# Patient Record
Sex: Female | Born: 2000 | Hispanic: Yes | Marital: Single | Smoking: Never smoker
Health system: Southern US, Community
[De-identification: ages and names within clinical notes are randomized; demographics above are authoritative.]

## PROBLEM LIST (undated history)

## (undated) DIAGNOSIS — F319 Bipolar disorder, unspecified: Secondary | ICD-10-CM

## (undated) DIAGNOSIS — N83209 Unspecified ovarian cyst, unspecified side: Secondary | ICD-10-CM

## (undated) DIAGNOSIS — J45909 Unspecified asthma, uncomplicated: Secondary | ICD-10-CM

---

## 2020-04-11 ENCOUNTER — Emergency Department (HOSPITAL_BASED_OUTPATIENT_CLINIC_OR_DEPARTMENT_OTHER): Payer: 59

## 2020-04-11 ENCOUNTER — Emergency Department (HOSPITAL_BASED_OUTPATIENT_CLINIC_OR_DEPARTMENT_OTHER)
Admission: EM | Admit: 2020-04-11 | Discharge: 2020-04-11 | Disposition: A | Discharge status: Home / Self Care | Payer: 59 | Attending: Emergency Medicine | Admitting: Emergency Medicine

## 2020-04-11 ENCOUNTER — Other Ambulatory Visit: Payer: Self-pay

## 2020-04-11 ENCOUNTER — Encounter (HOSPITAL_BASED_OUTPATIENT_CLINIC_OR_DEPARTMENT_OTHER): Payer: Self-pay | Admitting: *Deleted

## 2020-04-11 DIAGNOSIS — O26891 Other specified pregnancy related conditions, first trimester: Secondary | ICD-10-CM

## 2020-04-11 DIAGNOSIS — Z3A01 Less than 8 weeks gestation of pregnancy: Secondary | ICD-10-CM | POA: Insufficient documentation

## 2020-04-11 DIAGNOSIS — R102 Pelvic and perineal pain: Secondary | ICD-10-CM | POA: Insufficient documentation

## 2020-04-11 DIAGNOSIS — O219 Vomiting of pregnancy, unspecified: Secondary | ICD-10-CM | POA: Diagnosis not present

## 2020-04-11 DIAGNOSIS — R1084 Generalized abdominal pain: Secondary | ICD-10-CM | POA: Diagnosis not present

## 2020-04-11 DIAGNOSIS — O99891 Other specified diseases and conditions complicating pregnancy: Secondary | ICD-10-CM | POA: Insufficient documentation

## 2020-04-11 HISTORY — DX: Unspecified ovarian cyst, unspecified side: N83.209

## 2020-04-11 LAB — CBC WITH DIFFERENTIAL/PLATELET
Abs Immature Granulocytes: 0.05 10*3/uL (ref 0.00–0.07)
Basophils Absolute: 0 10*3/uL (ref 0.0–0.1)
Basophils Relative: 0 %
Eosinophils Absolute: 0.1 10*3/uL (ref 0.0–0.5)
Eosinophils Relative: 1 %
HCT: 37.3 % (ref 36.0–46.0)
Hemoglobin: 12.9 g/dL (ref 12.0–15.0)
Immature Granulocytes: 0 %
Lymphocytes Relative: 19 %
Lymphs Abs: 2.2 10*3/uL (ref 0.7–4.0)
MCH: 29.2 pg (ref 26.0–34.0)
MCHC: 34.6 g/dL (ref 30.0–36.0)
MCV: 84.4 fL (ref 80.0–100.0)
Monocytes Absolute: 0.9 10*3/uL (ref 0.1–1.0)
Monocytes Relative: 8 %
Neutro Abs: 8.4 10*3/uL — ABNORMAL HIGH (ref 1.7–7.7)
Neutrophils Relative %: 72 %
Platelets: 350 10*3/uL (ref 150–400)
RBC: 4.42 MIL/uL (ref 3.87–5.11)
RDW: 13 % (ref 11.5–15.5)
WBC: 11.6 10*3/uL — ABNORMAL HIGH (ref 4.0–10.5)
nRBC: 0 % (ref 0.0–0.2)

## 2020-04-11 LAB — COMPREHENSIVE METABOLIC PANEL
ALT: 13 U/L (ref 0–44)
AST: 20 U/L (ref 15–41)
Albumin: 4.6 g/dL (ref 3.5–5.0)
Alkaline Phosphatase: 55 U/L (ref 38–126)
Anion gap: 12 (ref 5–15)
BUN: 10 mg/dL (ref 6–20)
CO2: 18 mmol/L — ABNORMAL LOW (ref 22–32)
Calcium: 9.2 mg/dL (ref 8.9–10.3)
Chloride: 106 mmol/L (ref 98–111)
Creatinine, Ser: 0.56 mg/dL (ref 0.44–1.00)
GFR calc Af Amer: >60 mL/min (ref >60)
GFR calc non Af Amer: >60 mL/min (ref >60)
Glucose, Bld: 84 mg/dL (ref 70–99)
Potassium: 3.6 mmol/L (ref 3.5–5.1)
Sodium: 136 mmol/L (ref 135–145)
Total Bilirubin: 0.9 mg/dL (ref 0.3–1.2)
Total Protein: 7.5 g/dL (ref 6.5–8.1)

## 2020-04-11 LAB — URINALYSIS, ROUTINE W REFLEX MICROSCOPIC
Glucose, UA: NEGATIVE mg/dL
Hgb urine dipstick: NEGATIVE
Ketones, ur: >80 mg/dL — AB
Leukocytes,Ua: NEGATIVE
Nitrite: NEGATIVE
Protein, ur: NEGATIVE mg/dL
Specific Gravity, Urine: >1.03 — ABNORMAL HIGH (ref 1.005–1.030)
pH: 6 (ref 5.0–8.0)

## 2020-04-11 LAB — HCG, QUANTITATIVE, PREGNANCY: hCG, Beta Chain, Quant, S: 1788 m[IU]/mL — ABNORMAL HIGH (ref <5)

## 2020-04-11 LAB — HCG, SERUM, QUALITATIVE: Preg, Serum: POSITIVE — AB

## 2020-04-11 LAB — WET PREP, GENITAL
Clue Cells Wet Prep HPF POC: NONE SEEN
Sperm: NONE SEEN
Trich, Wet Prep: NONE SEEN
Yeast Wet Prep HPF POC: NONE SEEN

## 2020-04-11 LAB — PREGNANCY, URINE: Preg Test, Ur: POSITIVE — AB

## 2020-04-11 MED ORDER — ONDANSETRON HCL 4 MG/2ML IJ SOLN
4.0000 mg | Freq: Once | INTRAMUSCULAR | Status: AC
  Administered 2020-04-11: 4 mg via INTRAVENOUS
  Filled 2020-04-11: qty 2

## 2020-04-11 MED ORDER — FENTANYL CITRATE (PF) 100 MCG/2ML IJ SOLN
50.0000 ug | Freq: Once | INTRAMUSCULAR | Status: AC
  Administered 2020-04-11: 50 ug via INTRAVENOUS
  Filled 2020-04-11: qty 2

## 2020-04-11 MED ORDER — MORPHINE SULFATE (PF) 4 MG/ML IV SOLN
4.0000 mg | Freq: Once | INTRAVENOUS | Status: AC
  Administered 2020-04-11: 4 mg via INTRAVENOUS
  Filled 2020-04-11: qty 1

## 2020-04-11 MED ORDER — SODIUM CHLORIDE 0.9 % IV BOLUS
1000.0000 mL | Freq: Once | INTRAVENOUS | Status: AC
  Administered 2020-04-11: 1000 mL via INTRAVENOUS

## 2020-04-11 NOTE — ED Provider Notes (Signed)
[facility] EMERGENCY DEPARTMENT Provider Note   CSN: [id number] Arrival date & time: 04/11/20  1404     History Chief Complaint  Patient presents with  . Abdominal Pain    2wks preg    Martha Velasquez is a 19 y.o. female who presents for evaluation of acute onset abdominal pain that started about 20 minutes prior to ED arrival.  Patient reports that she was recently told that she was pregnant.  She went to her doctor's office and told that she was 2 to [redacted] weeks pregnant.  Her LMP was 03/09/20.  She reports she has had some nausea/vomiting since then as well as some occasional abdominal cramping.  She otherwise reports she has been in her normal state of health.  About 20 minutes prior to arrival, she started getting a sharp stabbing pain in her left lower quadrant.  She does have a history of ovarian cyst.  She states that she felt a gush but has not had any vaginal bleeding.  She has not had any fever, chest pain, dysuria, hematuria.  She was recently diagnosed with a UTI and states she is on antibiotics.  The history is provided by the patient.       Past Medical History:  Diagnosis Date  . Ovarian cyst     There are no problems to display for this patient.   History reviewed. No pertinent surgical history.   OB History   No obstetric history on file.     No family history on file.  Social History   Tobacco Use  . Smoking status: Never Smoker  . Smokeless tobacco: Never Used  Substance Use Topics  . Alcohol use: Not Currently  . Drug use: Not Currently    Home Medications Prior to Admission medications   Not on File    Allergies    Aspirin and Penicillins  Review of Systems   Review of Systems  Constitutional: Negative for fever.  Respiratory: Negative for cough and shortness of breath.   Cardiovascular: Negative for chest pain.  Gastrointestinal: Positive for abdominal pain. Negative for nausea and vomiting.  Genitourinary: Negative  for dysuria and hematuria.  Neurological: Negative for headaches.  All other systems reviewed and are negative.   Physical Exam Updated Vital Signs BP 128/88 (BP Location: Right Arm)   Pulse 68   Temp 98.9 F (37.2 C) (Oral)   Resp 16   Ht 5\' 2"  (1.575 m)   Wt 66.2 kg   LMP 03/10/2020   SpO2 99%   BMI 26.70 kg/m   Physical Exam Vitals and nursing note reviewed. Exam conducted with a chaperone present.  Constitutional:      Appearance: Normal appearance. She is well-developed.     Comments: Tearful  HENT:     Head: Normocephalic and atraumatic.  Eyes:     General: Lids are normal.     Conjunctiva/sclera: Conjunctivae normal.     Pupils: Pupils are equal, round, and reactive to light.  Cardiovascular:     Rate and Rhythm: Normal rate and regular rhythm.     Pulses: Normal pulses.     Heart sounds: Normal heart sounds. No murmur. No friction rub. No gallop.   Pulmonary:     Effort: Pulmonary effort is normal.     Breath sounds: Normal breath sounds.     Comments: Lungs clear to auscultation bilaterally.  Symmetric chest rise.  No wheezing, rales, rhonchi. Abdominal:     Palpations: Abdomen is soft. Abdomen  is not rigid.     Tenderness: There is abdominal tenderness in the suprapubic area and left lower quadrant. There is left CVA tenderness. There is no guarding.     Comments: Tenderness palpation noted to the suprapubic abdominal and left lower quadrant region.  There is some voluntary guarding.  No rigidity.  She has mild left-sided CVA tenderness.  Genitourinary:    Vagina: No vaginal discharge or bleeding.     Cervix: No cervical motion tenderness, discharge, erythema or cervical bleeding.     Adnexa:        Right: No mass or tenderness.         Left: Tenderness present. No mass.       Comments: The exam was performed with a chaperone present. Normal external female genitalia. No lesions, rash, or sores.  Cervical os appears closed.  No active bleeding.  Left adnexal  tenderness.  No palpable mass.  No right adnexal tenderness.  No CMT. Musculoskeletal:        General: Normal range of motion.     Cervical back: Full passive range of motion without pain.  Skin:    General: Skin is warm and dry.     Capillary Refill: Capillary refill takes less than 2 seconds.  Neurological:     Mental Status: She is alert and oriented to person, place, and time.  Psychiatric:        Speech: Speech normal.     ED Results / Procedures / Treatments   Labs (all labs ordered are listed, but only abnormal results are displayed) Labs Reviewed  WET PREP, GENITAL - Abnormal; Notable for the following components:      Result Value   WBC, Wet Prep HPF POC FEW (*)    All other components within normal limits  URINALYSIS, ROUTINE W REFLEX MICROSCOPIC - Abnormal; Notable for the following components:   APPearance HAZY (*)    Specific Gravity, Urine >1.030 (*)    Bilirubin Urine SMALL (*)    Ketones, ur >80 (*)    All other components within normal limits  PREGNANCY, URINE - Abnormal; Notable for the following components:   Preg Test, Ur POSITIVE (*)    All other components within normal limits  COMPREHENSIVE METABOLIC PANEL - Abnormal; Notable for the following components:   CO2 18 (*)    All other components within normal limits  CBC WITH DIFFERENTIAL/PLATELET - Abnormal; Notable for the following components:   WBC 11.6 (*)    Neutro Abs 8.4 (*)    All other components within normal limits  HCG, QUANTITATIVE, PREGNANCY - Abnormal; Notable for the following components:   hCG, Beta Chain, Quant, S 1,788 (*)    All other components within normal limits  HCG, SERUM, QUALITATIVE - Abnormal; Notable for the following components:   Preg, Serum POSITIVE (*)    All other components within normal limits  GC/CHLAMYDIA PROBE AMP ([place]) NOT AT [facility]    EKG None  Radiology US OB Transvaginal  Result Date: 04/11/2020 CLINICAL DATA:  Pelvic pain, beta HCG 1,788 EXAM:  TRANSVAGINAL OB ULTRASOUND TECHNIQUE: Transvaginal ultrasound was performed for complete evaluation of the gestation as well as the maternal uterus, adnexal regions, and pelvic cul-de-sac. COMPARISON:  None. FINDINGS: Intrauterine gestational sac: None Yolk sac:  Not Visualized. Embryo:  Not Visualized. Maternal uterus/adnexae: Endometrium is thickened measuring 16 mm. Right ovary measures 3.4 x 2.2 by 2.3 cm and the left ovary measures 4.7 x 2.8 by 3.3 cm. Within  the left ovary there is a complex hypoechoic structure measuring 1.9 by 2.3 x 1.4 cm, with increased vascularity. While this likely reflects a hemorrhagic cyst, serial beta HCG measurements and follow-up ultrasound will be needed to document intrauterine pregnancy and exclude ectopic pregnancy. No free fluid. IMPRESSION: 1. No evidence of intrauterine pregnancy on this exam. 2. Hypervascular complex cystic structure within the left ovary, likely a hemorrhagic cyst. However, serial beta HCG measurements and follow-up ultrasound recommended to document intrauterine pregnancy and exclude ectopic pregnancy. Electronically Signed   By: Sharlet Salina M.D.   On: 04/11/2020 16:39    Procedures Procedures (including critical care time)  Medications Ordered in ED Medications  ondansetron (ZOFRAN) injection 4 mg (4 mg Intravenous Given 04/11/20 1433)  morphine 4 MG/ML injection 4 mg (4 mg Intravenous Given 04/11/20 1434)  sodium chloride 0.9 % bolus 1,000 mL (0 mLs Intravenous Stopped 04/11/20 1641)  fentaNYL (SUBLIMAZE) injection 50 mcg (50 mcg Intravenous Given 04/11/20 1531)  sodium chloride 0.9 % bolus 1,000 mL ( Intravenous Stopped 04/11/20 1828)  ondansetron (ZOFRAN) injection 4 mg (4 mg Intravenous Given 04/11/20 1804)  fentaNYL (SUBLIMAZE) injection 50 mcg (50 mcg Intravenous Given 04/11/20 1803)    ED Course  I have reviewed the triage vital signs and the nursing notes.  Pertinent labs & imaging results that were available during my care of the  patient were reviewed by me and considered in my medical decision making (see chart for details).    MDM Rules/Calculators/A&P                      19 year old female with LMP of 03/09/20 who presents for evaluation of sudden abdominal pain.  She reports that she recently saw doctor and was told that she was pregnant.  Estimated about 2 to 3 weeks.  She states that today, she was out and started having severe, sudden onset left lower quadrant abdominal pain.  She does not know of any vaginal bleeding.  She was recently diagnosed with UTI and has been on antibiotics.  On initially arrival, she is afebrile, appears uncomfortable but no acute distress.  On exam, she has tenderness palpation suprapubic and left lower quadrant abdominal region.  Bedside ultrasound done for FAST exam which showed no evidence of intra-abdominal fluid.  Concern for threatened miscarriage versus ectopic. Doubt infectious etiology.  Plan for labs, formal ultrasound.  Given LMP, estimated gestational date would be 4 weeks 5 days.  EMERGENCY DEPARTMENT [place] PREGNANCY "Study: Limited Ultrasound of the Pelvis for Pregnancy"  INDICATIONS:Pregnancy(required) and Abdominal or pelvic pain Multiple views of the uterus and pelvic cavity were obtained in real-time with a multi-frequency probe.  APPROACH:Transabdominal  PERFORMED BY: Myself IMAGES ARCHIVED?: No LIMITATIONS: pain PREGNANCY FREE FLUID: None ADNEXAL FINDINGS: None GESTATIONAL AGE, ESTIMATE: N/A FETAL HEART RATE: N/A INTERPRETATION: No evidence of free fluid  hCG is positive, hcg quant is 1,788. CBC shows slight leukocytosis of 11.6.  CMP shows normal BUN and creatinine. UA with small bili, ketones.   Pelvic exam as documented above.  No CMT that would be concerning for PID.  She did have left adnexal tenderness.  Cervical os appeared closed.  No signs of bleeding.  Ultrasound shows no evidence of intrauterine pregnancy.  She has a hypervascular complex cystic  structure within the left ovary, likely a hemorrhagic cyst.  Discussed patient with Dr. Debroah Loop (OB/GYN).  Recommends follow-up hCG with OB/GYN in 2 days.  I discussed with Shawna Orleans (OB/GYN APP).  She  will add patient to the list and obtain an appointment for her.  I discussed results with patient and boyfriend at bedside.  Patient reports her pain has improved.  She is hemodynamically stable.  She has not had any vaginal bleeding here in the emergency department.  I discussed with patient that she will need to follow-up with referred OB/GYN in 2 days to get her beta hCG rechecked.  I discussed with her that if she has any worsening abdominal pain or vaginal bleeding, to go to women's center.  At this time, she is hemodynamically stable with improved pain.  Patient stable for discharge at this time. At this time, patient exhibits no emergent life-threatening condition that require further evaluation in ED. Patient had ample opportunity for questions and discussion. All patient's questions were answered with full understanding. Strict return precautions discussed. Patient expresses understanding and agreement to plan.   Portions of this note were generated with [employer]. Dictation errors may occur despite best attempts at proofreading.   Final Clinical Impression(s) / ED Diagnoses Final diagnoses:  Abdominal pain during pregnancy in first trimester    Rx / DC Orders ED Discharge Orders    None       Desma Mcgregor 04/11/20 1913    Blanchie Dessert, MD 04/12/20 2100

## 2020-04-11 NOTE — ED Notes (Signed)
Sudden onset left lower abd pain  States 2-3 weeks preg

## 2020-04-11 NOTE — Discharge Instructions (Signed)
As we discussed, your ultrasound did not see evidence of pregnancy in the uterus.  There is mention of a small cyst in the left ovary.  It is questionable whether or not this is early ectopic pregnancy or if it is too early for the pregnancy to be seen in the uterus.  You will need to follow-up with the provided [facility] to get a repeat beta hcg in 48 hours.  They will call you to arrange for a time.  If in the meantime, you start having severe abdominal pain, vaginal bleeding, return to the emergency department immediately.  Go to the [facility] in [place], [place].    You can take Tylenol for pain. You can take 1000 mg of Tylenol.  Do not exceed 4000 mg of Tylenol a day.  Return for any vaginal bleeding, abdominal pain.

## 2020-04-11 NOTE — ED Triage Notes (Signed)
Pt c/o lower abd pain x 20 mins,  pt states 2 weeks preg  unknown if vaginal bleeding

## 2020-04-12 LAB — GC/CHLAMYDIA PROBE AMP (~~LOC~~) NOT AT ARMC
Chlamydia: NEGATIVE
Comment: NEGATIVE
Comment: NORMAL
Neisseria Gonorrhea: NEGATIVE

## 2020-04-14 ENCOUNTER — Ambulatory Visit (INDEPENDENT_AMBULATORY_CARE_PROVIDER_SITE_OTHER): Payer: 59

## 2020-04-14 ENCOUNTER — Other Ambulatory Visit: Payer: Self-pay

## 2020-04-14 DIAGNOSIS — Z1331 Encounter for screening for depression: Secondary | ICD-10-CM

## 2020-04-14 DIAGNOSIS — Z658 Other specified problems related to psychosocial circumstances: Secondary | ICD-10-CM

## 2020-04-14 DIAGNOSIS — O3680X0 Pregnancy with inconclusive fetal viability, not applicable or unspecified: Secondary | ICD-10-CM

## 2020-04-14 LAB — BETA HCG QUANT (REF LAB): hCG Quant: 3607 m[IU]/mL

## 2020-04-14 NOTE — Progress Notes (Signed)
Pt here today for stat beta HCG following visit to ED for abdominal pain during pregnancy. No IUP visualized on [place] at that time. Pt reports no pain or vaginal bleeding since visit to ED on 04/11/20. Ectopic precautions reviewed with pt. Lab drawn. PHQ-9 and GAD-7 positive today. Referral to [facility] Jamie placed and pt scheduled for follow up appt.   Beta HCG result today 3607 which increased from 1788 on 04/11/20. Results reviewed with Debroah Loop, MD who states this is an appropriate rise in HCG level and pt should return for [place] 7 days from initial [place]. Patient called with results and provider suggestion. [place] scheduled for 04/22/20 at 1300 and pt notified.   Fleet Contras RN 04/14/20

## 2020-04-14 NOTE — BH Assessment (Addendum)
error 

## 2020-04-21 ENCOUNTER — Ambulatory Visit: Payer: 59 | Admitting: Clinical

## 2020-04-21 NOTE — BH Specialist Note (Signed)
Pt asks to reschedule for 04/26/20 at 8:15am virtual visit, due to schedule conflict today.

## 2020-04-22 ENCOUNTER — Ambulatory Visit
Admission: RE | Admit: 2020-04-22 | Discharge: 2020-04-22 | Disposition: A | Discharge status: Home / Self Care | Payer: 59 | Source: Ambulatory Visit | Attending: Obstetrics & Gynecology | Admitting: Obstetrics & Gynecology

## 2020-04-22 ENCOUNTER — Other Ambulatory Visit: Payer: Self-pay

## 2020-04-22 DIAGNOSIS — O3680X0 Pregnancy with inconclusive fetal viability, not applicable or unspecified: Secondary | ICD-10-CM | POA: Insufficient documentation

## 2020-04-25 ENCOUNTER — Telehealth: Payer: Self-pay

## 2020-04-25 NOTE — Telephone Encounter (Addendum)
-----   Message from Adam Phenix, MD sent at 04/22/2020  4:23 PM EDT ----- 6 week IUD, schedule prenatal care  Notified pt results that she is due 12/15/20.  I also informed pt that she contact a OB provider to start prenatal care to start taking PNV.  Pt verbalized understanding.  Addison Naegeli, RN  04/25/20

## 2020-04-26 ENCOUNTER — Telehealth (INDEPENDENT_AMBULATORY_CARE_PROVIDER_SITE_OTHER): Payer: 59

## 2020-04-26 DIAGNOSIS — O26899 Other specified pregnancy related conditions, unspecified trimester: Secondary | ICD-10-CM

## 2020-04-26 DIAGNOSIS — R110 Nausea: Secondary | ICD-10-CM

## 2020-04-26 MED ORDER — PROMETHAZINE HCL 25 MG PO TABS: 25.0000 mg | ORAL_TABLET | Freq: Four times a day (QID) | ORAL | 0 refills | Status: DC | PRN

## 2020-04-26 NOTE — Telephone Encounter (Signed)
Pt left VM on nurse line requesting a call from clinical staff to discuss nausea. Per chart review pt has a confirmed IUP. Called pt. Pt reports nausea throughout the day for the past week, has been unable to eat at times. Advice given about food choices, eating small meals, avoiding liquids while nauseas. Pt would like to receive prenatal care at our office; message sent to front office to schedule appts. Phenergan 25 mg every 6 hours rx sent to pt's preferred pharmacy per Vonzella Nipple, PA. Pt encouraged to call if nausea persists.

## 2020-04-27 NOTE — Progress Notes (Signed)
Patient ID: Martha Velasquez, female   DOB: 03-Feb-2001, 19 y.o.   MRN: [id number] Patient was assessed and managed by nursing staff during this encounter. I have reviewed the chart and agree with the documentation and plan. I have also made any necessary editorial changes.  Scheryl Darter, MD 04/27/2020 3:33 PM

## 2020-05-26 ENCOUNTER — Emergency Department (HOSPITAL_COMMUNITY)
Admission: EM | Admit: 2020-05-26 | Discharge: 2020-05-26 | Disposition: A | Discharge status: Home / Self Care | Payer: No Typology Code available for payment source | Attending: Emergency Medicine | Admitting: Emergency Medicine

## 2020-05-26 ENCOUNTER — Other Ambulatory Visit: Payer: Self-pay

## 2020-05-26 ENCOUNTER — Encounter (HOSPITAL_COMMUNITY): Payer: Self-pay | Admitting: Emergency Medicine

## 2020-05-26 DIAGNOSIS — Y939 Activity, unspecified: Secondary | ICD-10-CM | POA: Insufficient documentation

## 2020-05-26 DIAGNOSIS — S61012A Laceration without foreign body of left thumb without damage to nail, initial encounter: Secondary | ICD-10-CM | POA: Diagnosis present

## 2020-05-26 DIAGNOSIS — X58XXXA Exposure to other specified factors, initial encounter: Secondary | ICD-10-CM | POA: Diagnosis not present

## 2020-05-26 DIAGNOSIS — Y999 Unspecified external cause status: Secondary | ICD-10-CM | POA: Diagnosis not present

## 2020-05-26 DIAGNOSIS — Y929 Unspecified place or not applicable: Secondary | ICD-10-CM | POA: Diagnosis not present

## 2020-05-26 MED ORDER — LIDOCAINE HCL (PF) 1 % IJ SOLN
5.0000 mL | Freq: Once | INTRAMUSCULAR | Status: AC
  Administered 2020-05-26: 5 mL
  Filled 2020-05-26: qty 5

## 2020-05-26 NOTE — Discharge Instructions (Signed)
Sutures removed in 7 to 10 days.  Let warm soapy water

## 2020-05-26 NOTE — ED Provider Notes (Signed)
[facility] EMERGENCY DEPARTMENT Provider Note   CSN: [id number] Arrival date & time: 05/26/20  1336     History Chief Complaint  Patient presents with   Laceration    Martha Velasquez is a 19 y.o. female with past medical history who presents for evaluation of laceration.  Patient cut the distal aspect of her left first digit.  She has superficial cut to the lateral aspect of the nail however appears to be very superficial.  Her last tetanus was 3 years ago.  Patient denies lightheadedness, dizziness, fever, chills, bleeding, drainage, redness, swelling, warmth, paresthesias to extremity.  Laceration occurred just PTA was approximately 4 hours prior to evaluation.  Denies additional aggravating or relieving factors.  Obtained from patient past medical records.  No interpreter used.  HPI     Past Medical History:  Diagnosis Date   Ovarian cyst     There are no problems to display for this patient.   History reviewed. No pertinent surgical history.   OB History   No obstetric history on file.     No family history on file.  Social History   Tobacco Use   Smoking status: Never Smoker   Smokeless tobacco: Never Used  Substance Use Topics   Alcohol use: Not Currently   Drug use: Not Currently    Home Medications Prior to Admission medications   Medication Sig Start Date End Date Taking? Authorizing Provider  promethazine (PHENERGAN) 25 MG tablet Take 1 tablet (25 mg total) by mouth every 6 (six) hours as needed for nausea or vomiting. 04/26/20   Marny Lowenstein, PA-C    Allergies    Aspirin and Penicillins  Review of Systems   Review of Systems  Constitutional: Negative.   HENT: Negative.   Respiratory: Negative.   Cardiovascular: Negative.   Gastrointestinal: Negative.   Genitourinary: Negative.   Musculoskeletal: Negative.   Skin: Positive for wound.  Neurological: Negative.   All other systems reviewed and are  negative.   Physical Exam Updated Vital Signs BP 96/66 (BP Location: Right Arm)    Pulse 65    Temp 98.3 F (36.8 C) (Oral)    Resp 16    SpO2 96%   Physical Exam Vitals and nursing note reviewed.  Constitutional:      General: She is not in acute distress.    Appearance: She is well-developed. She is not ill-appearing or toxic-appearing.  HENT:     Head: Normocephalic and atraumatic.  Eyes:     Pupils: Pupils are equal, round, and reactive to light.  Cardiovascular:     Rate and Rhythm: Normal rate.     Pulses: Normal pulses.     Heart sounds: Normal heart sounds.  Pulmonary:     Effort: Pulmonary effort is normal. No respiratory distress.     Breath sounds: Normal breath sounds.  Abdominal:     General: There is no distension.  Musculoskeletal:        General: Normal range of motion.     Cervical back: Normal range of motion.     Comments: Use all 4 extremities without difficulty.  No bony tenderness.  Compartments soft  Skin:    General: Skin is warm and dry.     Capillary Refill: Capillary refill takes less than 2 seconds.     Comments: 5 mm laceration to distal aspect of left first digit.  No evidence of subungual hematoma.  There is very superficial cut on her nail however  does not appear to extend into the nailbed there is no underlying blood.  Nailbed firmly attached  Neurological:     Mental Status: She is alert.     Sensory: Sensation is intact.     Motor: Motor function is intact.     Coordination: Coordination is intact.    ED Results / Procedures / Treatments   Labs (all labs ordered are listed, but only abnormal results are displayed) Labs Reviewed - No data to display  EKG None  Radiology No results found.  Procedures .Marland KitchenLaceration Repair  Date/Time: 05/26/2020 5:28 PM Performed by: Linwood Dibbles, PA-C Authorized by: Linwood Dibbles, PA-C   Consent:    Consent obtained:  Verbal   Consent given by:  Patient   Risks discussed:   Infection, need for additional repair, pain, poor cosmetic result and poor wound healing   Alternatives discussed:  No treatment and delayed treatment Universal protocol:    Procedure explained and questions answered to patient or proxy's satisfaction: yes     Relevant documents present and verified: yes     Test results available and properly labeled: yes     Imaging studies available: yes     Required blood products, implants, devices, and special equipment available: yes     Site/side marked: yes     Immediately prior to procedure, a time out was called: yes     Patient identity confirmed:  Verbally with patient Laceration details:    Location:  Finger   Finger location:  L thumb   Length (cm):  0.5   Depth (mm):  3 Repair type:    Repair type:  Intermediate Pre-procedure details:    Preparation:  Patient was prepped and draped in usual sterile fashion and imaging obtained to evaluate for foreign bodies Exploration:    Hemostasis achieved with:  Direct pressure   Wound exploration: wound explored through full range of motion and entire depth of wound probed and visualized   Treatment:    Area cleansed with:  Betadine   Amount of cleaning:  Extensive   Irrigation solution:  Sterile saline   Irrigation volume:  1L Skin repair:    Repair method:  Sutures   Suture size:  5-0   Suture material:  Prolene   Suture technique:  Simple interrupted   Number of sutures:  3 Approximation:    Approximation:  Close Post-procedure details:    Dressing:  Non-adherent dressing   Patient tolerance of procedure:  Tolerated well, no immediate complications .Nerve Block  Date/Time: 05/26/2020 5:29 PM Performed by: Linwood Dibbles, PA-C Authorized by: Linwood Dibbles, PA-C   Consent:    Consent obtained:  Verbal   Consent given by:  Patient   Risks discussed:  Allergic reaction, infection, nerve damage, swelling, unsuccessful block, pain, intravenous injection and bleeding    Alternatives discussed:  No treatment, delayed treatment, alternative treatment and referral Indications:    Indications:  Pain relief and procedural anesthesia Location:    Body area:  Upper extremity   Laterality:  Left Pre-procedure details:    Skin preparation:  Alcohol Procedure details (see MAR for exact dosages):    Anesthetic injected:  Lidocaine 1% w/o epi   Steroid injected:  None   Additive injected:  None   Injection procedure:  Anatomic landmarks identified, incremental injection, negative aspiration for blood, introduced needle and anatomic landmarks palpated Post-procedure details:    Dressing:  Sterile dressing   Outcome:  Anesthesia achieved  Patient tolerance of procedure:  Tolerated well, no immediate complications   (including critical care time)  Medications Ordered in ED Medications  lidocaine (PF) (XYLOCAINE) 1 % injection 5 mL (5 mLs Infiltration Given 05/26/20 1708)    ED Course  I have reviewed the triage vital signs and the nursing notes.  Pertinent labs & imaging results that were available during my care of the patient were reviewed by me and considered in my medical decision making (see chart for details).  18 with laceration to digit on left upper extremity.  Nailbed firmly attached.  5 mm laceration distal aspect of left thumb.  No active bleeding or drainage.  She is neurovascularly intact.  No evidence of bony abnormality.  Normal musculoskeletal exam.  Superficial wound.  Do not feel she needs imaging at this time.  Her tetanus is up-to-date.  #3 Prolene sutures placed.  Discussed follow-up.  Discussed return precautions.  The patient has been appropriately medically screened and/or stabilized in the ED. I have low suspicion for any other emergent medical condition which would require further screening, evaluation or treatment in the ED or require inpatient management.  Patient is hemodynamically stable and in no acute distress.  Patient able to  ambulate in department prior to ED.  Evaluation does not show acute pathology that would require ongoing or additional emergent interventions while in the emergency department or further inpatient treatment.  I have discussed the diagnosis with the patient and answered all questions.  Pain is been managed while in the emergency department and patient has no further complaints prior to discharge.  Patient is comfortable with plan discussed in room and is stable for discharge at this time.  I have discussed strict return precautions for returning to the emergency department.  Patient was encouraged to follow-up with PCP/specialist refer to at discharge.   MDM Rules/Calculators/A&P                          Final Clinical Impression(s) / ED Diagnoses Final diagnoses:  Laceration of left thumb without foreign body, nail damage status unspecified, initial encounter    Rx / DC Orders ED Discharge Orders    None       Jackelin Correia A, PA-C 05/26/20 1737    Mancel Bale, MD 05/26/20 2342

## 2020-05-26 NOTE — ED Triage Notes (Signed)
Pt here to be check for a laceration on her left thumb, pt states she cut her self while at work.

## 2020-06-02 ENCOUNTER — Telehealth (INDEPENDENT_AMBULATORY_CARE_PROVIDER_SITE_OTHER): Payer: 59 | Admitting: *Deleted

## 2020-06-02 ENCOUNTER — Telehealth: Payer: Self-pay | Admitting: Family Medicine

## 2020-06-02 ENCOUNTER — Encounter: Payer: Self-pay | Admitting: *Deleted

## 2020-06-02 DIAGNOSIS — Z5320 Procedure and treatment not carried out because of patient's decision for unspecified reasons: Secondary | ICD-10-CM

## 2020-06-02 DIAGNOSIS — Z3490 Encounter for supervision of normal pregnancy, unspecified, unspecified trimester: Secondary | ICD-10-CM

## 2020-06-02 NOTE — Telephone Encounter (Signed)
Attempted to contact patient in regards to her missed intake appointment. No answer, left voicemail instructing patient to keep her upcoming appointment on 7/21.

## 2020-06-02 NOTE — Progress Notes (Signed)
9:12:  Allegra not connected virtually for her new ob intake visit today.I called her mobile/home number and heard a message " your call cannot be completed. ". Besse Miron,RN  9:19 I called Twanisha mobile/ home number and again heard message " your call cannot be completed". I called her contact Deberry and heard a message " mailbox is full and cannot accept messages. ". Brooklyn Jeff,RN  9:21 I called [place] mobile/ home number again and heard message " your call cannot be completed". Patient will need to be rescheduled. Jeidy Hoerner,RN

## 2020-06-08 ENCOUNTER — Encounter: Payer: 59 | Admitting: Women's Health

## 2020-06-08 ENCOUNTER — Encounter: Payer: Self-pay | Admitting: Family Medicine

## 2020-06-13 LAB — OB RESULTS CONSOLE HEPATITIS B SURFACE ANTIGEN: Hepatitis B Surface Ag: NEGATIVE

## 2020-06-13 LAB — OB RESULTS CONSOLE HIV ANTIBODY (ROUTINE TESTING): HIV: NONREACTIVE

## 2020-06-13 LAB — OB RESULTS CONSOLE RPR: RPR: NONREACTIVE

## 2020-06-13 LAB — OB RESULTS CONSOLE RUBELLA ANTIBODY, IGM: Rubella: IMMUNE

## 2020-09-06 LAB — OB RESULTS CONSOLE RPR: RPR: NONREACTIVE

## 2020-09-06 LAB — OB RESULTS CONSOLE HIV ANTIBODY (ROUTINE TESTING): HIV: NONREACTIVE

## 2020-11-19 NOTE — L&D Delivery Note (Signed)
Delivery Note   Patient Name: Martha Velasquez DOB: 2001/07/12 MRN: [id number]  Date of admission: 12/13/2020 Delivering MD: Dale Nucla  Date of delivery: 12/14/20 Type of delivery: SVD  Newborn Data: Live born female  Birth Weight:   APGAR: 8, 9  Newborn Delivery   Birth date/time: 12/14/2020 12:27:00 Delivery type: Vaginal, Spontaneous     Martha Velasquez, 20 y.o., @ [redacted]w[redacted]d,  G1P0, who was admitted for spontaneous labor, augmented with AROM , variables noted. I was called to the room when she progressed 1+ station in the second stage of labor.  She pushed for 1hours/81min.  She delivered a viable infant, cephalic and restituted to the ROA position over an intact perineum.  A nuchal cord   was identified, loose and reduced. The baby was placed on maternal abdomen while initial step of NRP were perfmored (Dry, Stimulated, and warmed). Hat placed on baby for thermoregulation. Delayed cord clamping was performed for 3.5 minutes.  Cord double clamped and cut.  Cord cut by father. Apgar scores were 8 and 9. Prophylactic Pitocin was started in the third stage of labor for active management. The placenta delivered spontaneously, shultz, with a 3 vessel cord and was sent to LD.  Inspection revealed none. An examination of the vaginal vault and cervix was free from lacerations. The uterus was firm, bleeding stable.   Placenta and umbilical artery blood gas were not sent.  There were no complications during the procedure.  Mom and baby skin to skin following delivery. Left in stable condition.  Maternal Info: Anesthesia: Epidural Episiotomy: No Lacerations:  No Suture Repair: No Est. Blood Loss (mL):   Newborn Info:  Baby Sex: female Circumcision: in pt desired Babies Name: Simonne Come APGAR (1 MIN): 8   APGAR (5 MINS): 9   APGAR (10 MINS):     Mom to postpartum.  Baby to Couplet care / Skin to Skin.  Bipolar: Discussed medication with pt immediately PP. Pt endorses she has  in the past month, spent "way too much money". Endorses feeling out of control, like an "outter body experience, like I am not myself", she ednorses sleeping too much, feeling angry and taking her problems out on everyone else. She stated she has been stable on fluoxitine during this pregnancy 20mg  PO, has never been to therapy but endorses desires to be signed up. Discussed the risk on only being on an SSRI with bipolar and risk of manic to psychotic episodes. Discussed starting abilify 2mg  PO daily. Reviewed therapist, with [facility] as well [facility] [place]. Will need referral and 2 week PP mood check. Peds will need to be inform that mother was started on abilify. Pt instructed to watch infant feeding ques.  Aripiprazole-Although definitive data regarding the use of aripiprazole during breastfeeding are lacking [20,21], a review concluded that the results from case reports, other literature reviews, and practice guidelines were reassuring [22]. As an example, one case report described a mother-infant pair in which the mother was prescribed aripiprazole; no acute adverse effects occurred in the infant [23]. Two separate case reports found that aripiprazole was detectable in breast milk [24,25], whereas one case report found that the drug was not detectable [23]. In addition, one treatment guideline suggests that aripiprazole is a reasonable option for lactating patients because infant exposure is relatively low compared with some other antipsychotics and adverse effects in nursing children have not been reported [12] (UTD, 2021). Aripiprazole and dehydro-aripiprazole are present in  breast milk. (Nordeng 2014) The relative infant dose (RID) of aripiprazole is 8.3% when calculated using the highest mean breast milk concentration located and compared to a weight-adjusted maternal dose of 10 mg/day. In general, breastfeeding is considered acceptable when the RID of a medication is  <10% Dareen Piano 2016; Vanita Panda 2000). However, some sources note breastfeeding should only be considered if the RID is <5% for psychotropic agents Tivis Ringer 2015). The RID of aripiprazole was calculated using a mean milk concentration of 56 ng/mL (aripiprazole) and 8.8 ng/mL (dehydro-aripiprazole), providing an estimated daily infant dose via breast milk of 47 mcg/day. This milk concentration was obtained following maternal administration of aripiprazole 10 mg/day during pregnancy and while breastfeeding. Milk samples were obtained at 8 and 10 weeks' postpartum. The RID was calculated by the authors of the study using the actual maternal weight. Peak milk concentrations appeared ~3 hours after the dose, but remained relatively constant throughout the dosing interval (Nordeng 2014). Although reports of aripiprazole use in breastfeeding women are limited, lactation failure has been observed in some cases Morton Amy 2010; Mendhekar 2006; Nordeng 2014). In general, infants exposed to second generation antipsychotics via breast milk should be monitored weekly for the first month of exposure for symptoms, such as appetite changes, insomnia, irritability, or lethargy Claris Gladden 2016). According to the manufacturer, the decision to breastfeed during therapy should consider the risk of infant exposure, the benefits of breastfeeding to the infant, and benefits of treatment to the mother. Until additional information is available, use of agents other than aripiprazole in breastfeeding women is preferred (Pacchiarotti 2016; Claris Gladden 2016). Black Box warning discussed with pt to report in crease of SI/HI, pt denies HI/SI now. Antidepressants increased the risk of suicidal thoughts and behavior in children, adolescents, and young adults in short-term studies. These studies did not show an increase in the risk of suicidal thoughts and behavior with antidepressant use in patients older than 21 years of age; there was a reduction in risk with  antidepressant use in patients 21 years of age and older. Closely monitor all antidepressant-treated patients for clinical worsening and for the emergence of suicidal thoughts and behaviors. Advise families and caregivers of the need for close observation and communication with the prescriber. The safety and efficacy of aripiprazole tablets with sensor have not been established in pediatric patients (Lexicomp). Also discussed side effect of medication.    DR Normand Sloop updated.   [place], [place], NP-C 12/14/20 1:14 PM

## 2020-11-22 DIAGNOSIS — Z3492 Encounter for supervision of normal pregnancy, unspecified, second trimester: Secondary | ICD-10-CM | POA: Diagnosis not present

## 2020-11-22 LAB — OB RESULTS CONSOLE GBS: GBS: NEGATIVE

## 2020-11-30 DIAGNOSIS — Z880 Allergy status to penicillin: Secondary | ICD-10-CM | POA: Diagnosis not present

## 2020-11-30 DIAGNOSIS — Z3A38 38 weeks gestation of pregnancy: Secondary | ICD-10-CM | POA: Diagnosis not present

## 2020-11-30 DIAGNOSIS — Z888 Allergy status to other drugs, medicaments and biological substances status: Secondary | ICD-10-CM | POA: Diagnosis not present

## 2020-11-30 DIAGNOSIS — F319 Bipolar disorder, unspecified: Secondary | ICD-10-CM | POA: Diagnosis not present

## 2020-11-30 DIAGNOSIS — O365990 Maternal care for other known or suspected poor fetal growth, unspecified trimester, not applicable or unspecified: Secondary | ICD-10-CM | POA: Diagnosis not present

## 2020-11-30 DIAGNOSIS — O09619 Supervision of young primigravida, unspecified trimester: Secondary | ICD-10-CM | POA: Diagnosis not present

## 2020-11-30 DIAGNOSIS — Z331 Pregnant state, incidental: Secondary | ICD-10-CM | POA: Diagnosis not present

## 2020-11-30 DIAGNOSIS — J45909 Unspecified asthma, uncomplicated: Secondary | ICD-10-CM | POA: Diagnosis not present

## 2020-12-12 ENCOUNTER — Other Ambulatory Visit: Payer: Self-pay

## 2020-12-12 ENCOUNTER — Encounter (HOSPITAL_COMMUNITY): Payer: Self-pay | Admitting: Obstetrics & Gynecology

## 2020-12-12 ENCOUNTER — Inpatient Hospital Stay (HOSPITAL_COMMUNITY)
Admission: AD | Admit: 2020-12-12 | Discharge: 2020-12-12 | Disposition: A | Discharge status: Home / Self Care | Payer: Medicaid Other | Attending: Obstetrics & Gynecology | Admitting: Obstetrics & Gynecology

## 2020-12-12 DIAGNOSIS — O471 False labor at or after 37 completed weeks of gestation: Secondary | ICD-10-CM | POA: Diagnosis not present

## 2020-12-12 DIAGNOSIS — Z3A39 39 weeks gestation of pregnancy: Secondary | ICD-10-CM | POA: Diagnosis not present

## 2020-12-12 DIAGNOSIS — O479 False labor, unspecified: Secondary | ICD-10-CM

## 2020-12-12 DIAGNOSIS — Z3689 Encounter for other specified antenatal screening: Secondary | ICD-10-CM

## 2020-12-12 NOTE — MAU Note (Signed)
Pt reports contractions since 0430, denies bleeding or ROM. Last SVE closed. Reports good fetal movement.

## 2020-12-13 ENCOUNTER — Other Ambulatory Visit: Payer: Self-pay

## 2020-12-13 ENCOUNTER — Inpatient Hospital Stay (HOSPITAL_COMMUNITY)
Admission: AD | Admit: 2020-12-13 | Discharge: 2020-12-16 | DRG: 805 | Disposition: A | Discharge status: Home / Self Care | Payer: Medicaid Other | Attending: Obstetrics and Gynecology | Admitting: Obstetrics and Gynecology

## 2020-12-13 ENCOUNTER — Encounter (HOSPITAL_COMMUNITY): Payer: Self-pay | Admitting: Obstetrics & Gynecology

## 2020-12-13 DIAGNOSIS — Z886 Allergy status to analgesic agent status: Secondary | ICD-10-CM

## 2020-12-13 DIAGNOSIS — Z880 Allergy status to penicillin: Secondary | ICD-10-CM | POA: Diagnosis not present

## 2020-12-13 DIAGNOSIS — O9852 Other viral diseases complicating childbirth: Secondary | ICD-10-CM | POA: Diagnosis present

## 2020-12-13 DIAGNOSIS — O9081 Anemia of the puerperium: Secondary | ICD-10-CM | POA: Diagnosis not present

## 2020-12-13 DIAGNOSIS — Z3A39 39 weeks gestation of pregnancy: Secondary | ICD-10-CM

## 2020-12-13 DIAGNOSIS — Z331 Pregnant state, incidental: Secondary | ICD-10-CM | POA: Diagnosis not present

## 2020-12-13 DIAGNOSIS — O09619 Supervision of young primigravida, unspecified trimester: Secondary | ICD-10-CM | POA: Diagnosis not present

## 2020-12-13 DIAGNOSIS — D62 Acute posthemorrhagic anemia: Secondary | ICD-10-CM | POA: Diagnosis not present

## 2020-12-13 DIAGNOSIS — U071 COVID-19: Secondary | ICD-10-CM | POA: Diagnosis present

## 2020-12-13 DIAGNOSIS — O99344 Other mental disorders complicating childbirth: Secondary | ICD-10-CM | POA: Diagnosis present

## 2020-12-13 DIAGNOSIS — F3181 Bipolar II disorder: Secondary | ICD-10-CM | POA: Diagnosis present

## 2020-12-13 HISTORY — DX: Bipolar disorder, unspecified: F31.9

## 2020-12-13 HISTORY — DX: Unspecified asthma, uncomplicated: J45.909

## 2020-12-13 NOTE — MAU Note (Signed)
PT SAYS HAVING STRONG UC'S . PNC WITH [facility]- DR PINN- VE 2 CM.  DENIS HSV AND MRSA. GBS- NEG

## 2020-12-13 NOTE — H&P (Signed)
Martha Velasquez is a 20 y.o. female, G1P0000, IUP at 39.6 weeks, presenting for spontaneous latent labor. Pt endorse + Fm. Denies vaginal leakage. Denies vaginal bleeding. Teenage pregnancy (18). Bipolar disorder (restarted fluoxitine 20 mg daily). Asthma (no meds). Allergy to penicillin and ASA. GBS-. LR-Female, desires in pt circ. Late entry into [facility].   Patient Active Problem List   Diagnosis Date Noted  . Supervision of low-risk pregnancy 06/02/2020     Medications Prior to Admission  Medication Sig Dispense Refill Last Dose  . Prenatal Vit-Fe Fumarate-FA (MULTIVITAMIN-PRENATAL) 27-0.8 MG TABS tablet Take 1 tablet by mouth daily at 12 noon.   12/13/2020 at Unknown time  . promethazine (PHENERGAN) 25 MG tablet Take 1 tablet (25 mg total) by mouth every 6 (six) hours as needed for nausea or vomiting. 30 tablet 0 Past Month at Unknown time    Past Medical History:  Diagnosis Date  . Ovarian cyst      No current facility-administered medications on file prior to encounter.   Current Outpatient Medications on File Prior to Encounter  Medication Sig Dispense Refill  . Prenatal Vit-Fe Fumarate-FA (MULTIVITAMIN-PRENATAL) 27-0.8 MG TABS tablet Take 1 tablet by mouth daily at 12 noon.    . promethazine (PHENERGAN) 25 MG tablet Take 1 tablet (25 mg total) by mouth every 6 (six) hours as needed for nausea or vomiting. 30 tablet 0     Allergies  Allergen Reactions  . Aspirin   . Penicillins     History of present pregnancy: Pt Info/Preference:  Screening/Consents:  Labs:   EDD: Estimated Date of Delivery: 12/14/20  Establised: Patient's last menstrual period was 03/09/2020.  Anatomy Scan: Date: 07/28/2020 Placenta Location: anterior Genetic Screen: Panoroma:LR Female AFP:  First Tri: Quad:  Office: [facility]            First PNV: 20.1 wg Blood Type  Q+  Language: english Last PNV: 39.6 Rhogam  N/A  Flu Vaccine:  Declined   Antibody  Neg  TDaP vaccine Declined   GTT: Early: 4.9 Third  Trimester: 56  Feeding Plan: breast BTL: no Rubella:  Immune  Contraception: ??? VBAC: no RPR:   NR  Circumcision: In pt desired   HBsAg:  Neg  Pediatrician:  ???   HIV:   Neg  Prenatal Classes: no Additional [place]: 1/12 6.9lbs, 26%, afi 10.6 GBS:  Negative 11/22/2020(For PCN allergy, check sensitivities)       Chlamydia: neg    MFM Referral/Consult:  GC: neg  Support Person: partner   PAP: N/A  Pain Management: Natural for now Neonatologist Referral:  Hgb Electrophoresis:  AA  Birth Plan: DCC   Hgb NOB: 11.8    28W: 12   OB History    Gravida  1   Para      Term      Preterm      AB      Living        SAB      IAB      Ectopic      Multiple      Live Births             Past Medical History:  Diagnosis Date  . Ovarian cyst    History reviewed. No pertinent surgical history. Family History: family history is not on file. Social History:  reports that she has never smoked. She has never used smokeless tobacco. She reports previous alcohol use. She reports previous drug use.   Prenatal Transfer  Tool  Maternal Diabetes: No Genetic Screening: Normal Maternal Ultrasounds/Referrals: Normal Fetal Ultrasounds or other Referrals:  None Maternal Substance Abuse:  No Significant Maternal Medications:  None Significant Maternal Lab Results: Group B Strep negative  ROS:  Review of Systems  Constitutional: Negative.   HENT: Negative.   Eyes: Negative.   Respiratory: Negative.   Cardiovascular: Negative.   Gastrointestinal: Positive for abdominal pain.  Genitourinary: Negative.   Musculoskeletal: Negative.   Skin: Negative.   Neurological: Negative.   Endo/Heme/Allergies: Negative.      Physical Exam: BP 125/84 (BP Location: Right Arm)   Pulse 96   Temp 97.9 F (36.6 C)   Resp 20   Ht 5\' 3"  (1.6 m)   Wt 83.4 kg   LMP 03/09/2020   BMI 32.58 kg/m   Physical Exam Vitals and nursing note reviewed. Exam conducted with a chaperone present.  Constitutional:       Appearance: Normal appearance.  HENT:     Head: Normocephalic.     Nose: Nose normal.  Eyes:     Conjunctiva/sclera: Conjunctivae normal.     Pupils: Pupils are equal, round, and reactive to light.  Cardiovascular:     Rate and Rhythm: Normal rate and regular rhythm.     Pulses: Normal pulses.     Heart sounds: Normal heart sounds.  Pulmonary:     Effort: Pulmonary effort is normal.     Breath sounds: Normal breath sounds.  Abdominal:     General: Bowel sounds are normal.  Genitourinary:    Comments: Pelvis adequate, uterus gravida equal to dates.  Musculoskeletal:        General: Normal range of motion.     Cervical back: Normal range of motion and neck supple.  Skin:    General: Skin is warm.     Capillary Refill: Capillary refill takes less than 2 seconds.  Neurological:     General: No focal deficit present.     Mental Status: She is alert.  Psychiatric:        Mood and Affect: Mood normal.      NST: FHR baseline 135 bpm, Variability: moderate, Accelerations:present, Decelerations:  Absent now, but had x2 decel to nadir of 80s in MAU= Cat 1/Reactive UC:   irregular, every 3-8 minutes, lasting 50 seconds SVE:   Dilation: 4 Effacement (%): 80 Station: -2 Exam by:: Debra C, RN, vertex verified by fetal sutures.  Leopold's: Position vertex, EFW 7.5lbs via leopold's.   Labs: No results found for this or any previous visit (from the past 24 hour(s)).  Imaging:  No results found.  MAU Course: Orders Placed This Encounter  Procedures  . Cervical Exam   No orders of the defined types were placed in this encounter.   Assessment/Plan: Martha Velasquez is a 20 y.o. female, G1P0000, IUP at 39.6 weeks, presenting for spontaneous latent labor. Pt endorse + Fm. Denies vaginal leakage. Denies vaginal bleeding. Teenage pregnancy (18). Bipolar disorder (restarted fluoxitine 20 mg daily). Asthma (no meds). Allergy to penicillin and ASA. GBS-. LR-Female, desires in pt  circ. Late entry into [facility].   FWB: Cat 1 Fetal Tracing.   Plan: Admit to Birthing Suite per consult with Dr [facility] Bipolar: Continue fluoxitine, but recommended as adjunct only to to increase risk of manic phase with SSRI only, pt should be started on mood stabilizer, will follow up PP.  Routine [facility] orders Pain med/epidural prn Anticipate labor progression   Mora Appl NP-C, CNM, MSN 12/13/2020, 11:52 PM

## 2020-12-14 ENCOUNTER — Inpatient Hospital Stay (HOSPITAL_COMMUNITY): Payer: Medicaid Other | Admitting: Anesthesiology

## 2020-12-14 ENCOUNTER — Other Ambulatory Visit: Payer: Self-pay

## 2020-12-14 ENCOUNTER — Encounter (HOSPITAL_COMMUNITY): Payer: Self-pay | Admitting: Obstetrics & Gynecology

## 2020-12-14 DIAGNOSIS — O9852 Other viral diseases complicating childbirth: Secondary | ICD-10-CM | POA: Diagnosis not present

## 2020-12-14 DIAGNOSIS — Z886 Allergy status to analgesic agent status: Secondary | ICD-10-CM | POA: Diagnosis not present

## 2020-12-14 DIAGNOSIS — Z3A40 40 weeks gestation of pregnancy: Secondary | ICD-10-CM | POA: Diagnosis not present

## 2020-12-14 DIAGNOSIS — F3181 Bipolar II disorder: Secondary | ICD-10-CM | POA: Diagnosis not present

## 2020-12-14 DIAGNOSIS — O26893 Other specified pregnancy related conditions, third trimester: Secondary | ICD-10-CM | POA: Diagnosis not present

## 2020-12-14 DIAGNOSIS — Z880 Allergy status to penicillin: Secondary | ICD-10-CM | POA: Diagnosis not present

## 2020-12-14 DIAGNOSIS — O99344 Other mental disorders complicating childbirth: Secondary | ICD-10-CM | POA: Diagnosis not present

## 2020-12-14 DIAGNOSIS — Z3A39 39 weeks gestation of pregnancy: Secondary | ICD-10-CM | POA: Diagnosis not present

## 2020-12-14 DIAGNOSIS — O9081 Anemia of the puerperium: Secondary | ICD-10-CM | POA: Diagnosis not present

## 2020-12-14 DIAGNOSIS — U071 COVID-19: Secondary | ICD-10-CM | POA: Diagnosis not present

## 2020-12-14 DIAGNOSIS — D62 Acute posthemorrhagic anemia: Secondary | ICD-10-CM | POA: Diagnosis not present

## 2020-12-14 LAB — SARS CORONAVIRUS 2 BY RT PCR (HOSPITAL ORDER, PERFORMED IN ~~LOC~~ HOSPITAL LAB): SARS Coronavirus 2: POSITIVE — AB

## 2020-12-14 LAB — RPR: RPR Ser Ql: NONREACTIVE

## 2020-12-14 LAB — CBC
HCT: 29.2 % — ABNORMAL LOW (ref 36.0–46.0)
Hemoglobin: 9.8 g/dL — ABNORMAL LOW (ref 12.0–15.0)
MCH: 28.5 pg (ref 26.0–34.0)
MCHC: 33.6 g/dL (ref 30.0–36.0)
MCV: 84.9 fL (ref 80.0–100.0)
Platelets: 266 10*3/uL (ref 150–400)
RBC: 3.44 MIL/uL — ABNORMAL LOW (ref 3.87–5.11)
RDW: 14.9 % (ref 11.5–15.5)
WBC: 17.5 10*3/uL — ABNORMAL HIGH (ref 4.0–10.5)
nRBC: 0 % (ref 0.0–0.2)

## 2020-12-14 LAB — TYPE AND SCREEN
ABO/RH(D): O POS
Antibody Screen: NEGATIVE

## 2020-12-14 MED ORDER — WITCH HAZEL-GLYCERIN EX PADS: 1.0000 "application " | MEDICATED_PAD | CUTANEOUS | Status: DC | PRN

## 2020-12-14 MED ORDER — FLUOXETINE HCL 20 MG PO CAPS
20.0000 mg | ORAL_CAPSULE | Freq: Every day | ORAL | Status: DC
  Administered 2020-12-14 – 2020-12-16 (×3): 20 mg via ORAL
  Filled 2020-12-14 (×3): qty 1

## 2020-12-14 MED ORDER — LIDOCAINE HCL (PF) 1 % IJ SOLN
INTRAMUSCULAR | Status: DC | PRN
  Administered 2020-12-14 (×2): 4 mL via EPIDURAL

## 2020-12-14 MED ORDER — LACTATED RINGERS IV SOLN: INTRAVENOUS | Status: DC

## 2020-12-14 MED ORDER — FENTANYL CITRATE (PF) 100 MCG/2ML IJ SOLN
50.0000 ug | INTRAMUSCULAR | Status: DC | PRN
  Administered 2020-12-14 (×2): 100 ug via INTRAVENOUS
  Filled 2020-12-14 (×2): qty 2

## 2020-12-14 MED ORDER — ACETAMINOPHEN 325 MG PO TABS: 650.0000 mg | ORAL_TABLET | ORAL | Status: DC | PRN

## 2020-12-14 MED ORDER — OXYCODONE-ACETAMINOPHEN 5-325 MG PO TABS
2.0000 | ORAL_TABLET | ORAL | Status: DC | PRN
Start: 2020-12-14 — End: 2020-12-14

## 2020-12-14 MED ORDER — PHENYLEPHRINE 40 MCG/ML (10ML) SYRINGE FOR IV PUSH (FOR BLOOD PRESSURE SUPPORT): 80.0000 ug | PREFILLED_SYRINGE | INTRAVENOUS | Status: DC | PRN

## 2020-12-14 MED ORDER — PRENATAL MULTIVITAMIN CH
1.0000 | ORAL_TABLET | Freq: Every day | ORAL | Status: DC
  Administered 2020-12-15 – 2020-12-16 (×2): 1 via ORAL
  Filled 2020-12-14 (×2): qty 1

## 2020-12-14 MED ORDER — EPHEDRINE 5 MG/ML INJ: 10.0000 mg | INTRAVENOUS | Status: DC | PRN

## 2020-12-14 MED ORDER — ACETAMINOPHEN 325 MG PO TABS
650.0000 mg | ORAL_TABLET | ORAL | Status: DC | PRN
  Administered 2020-12-15 – 2020-12-16 (×4): 650 mg via ORAL
  Filled 2020-12-14 (×4): qty 2

## 2020-12-14 MED ORDER — DIPHENHYDRAMINE HCL 50 MG/ML IJ SOLN: 12.5000 mg | INTRAMUSCULAR | Status: DC | PRN

## 2020-12-14 MED ORDER — COCONUT OIL OIL
1.0000 "application " | TOPICAL_OIL | Status: DC | PRN
  Administered 2020-12-15: 1 via TOPICAL

## 2020-12-14 MED ORDER — LACTATED RINGERS IV SOLN
500.0000 mL | INTRAVENOUS | Status: DC | PRN
  Administered 2020-12-14: 500 mL via INTRAVENOUS

## 2020-12-14 MED ORDER — OXYCODONE-ACETAMINOPHEN 5-325 MG PO TABS: 1.0000 | ORAL_TABLET | ORAL | Status: DC | PRN

## 2020-12-14 MED ORDER — TETANUS-DIPHTH-ACELL PERTUSSIS 5-2.5-18.5 LF-MCG/0.5 IM SUSY
0.5000 mL | PREFILLED_SYRINGE | Freq: Once | INTRAMUSCULAR | Status: DC
  Filled 2020-12-14: qty 0.5

## 2020-12-14 MED ORDER — ZOLPIDEM TARTRATE 5 MG PO TABS: 5.0000 mg | ORAL_TABLET | Freq: Every evening | ORAL | Status: DC | PRN

## 2020-12-14 MED ORDER — LACTATED RINGERS IV SOLN: 500.0000 mL | Freq: Once | INTRAVENOUS | Status: DC

## 2020-12-14 MED ORDER — ONDANSETRON HCL 4 MG PO TABS: 4.0000 mg | ORAL_TABLET | ORAL | Status: DC | PRN

## 2020-12-14 MED ORDER — ONDANSETRON HCL 4 MG/2ML IJ SOLN: 4.0000 mg | Freq: Four times a day (QID) | INTRAMUSCULAR | Status: DC | PRN

## 2020-12-14 MED ORDER — LIDOCAINE HCL (PF) 1 % IJ SOLN: 30.0000 mL | INTRAMUSCULAR | Status: DC | PRN

## 2020-12-14 MED ORDER — DIPHENHYDRAMINE HCL 25 MG PO CAPS: 25.0000 mg | ORAL_CAPSULE | Freq: Four times a day (QID) | ORAL | Status: DC | PRN

## 2020-12-14 MED ORDER — IBUPROFEN 600 MG PO TABS
600.0000 mg | ORAL_TABLET | Freq: Four times a day (QID) | ORAL | Status: DC
  Administered 2020-12-14 – 2020-12-16 (×8): 600 mg via ORAL
  Filled 2020-12-14 (×8): qty 1

## 2020-12-14 MED ORDER — SOD CITRATE-CITRIC ACID 500-334 MG/5ML PO SOLN: 30.0000 mL | ORAL | Status: DC | PRN

## 2020-12-14 MED ORDER — DIBUCAINE (PERIANAL) 1 % EX OINT: 1.0000 "application " | TOPICAL_OINTMENT | CUTANEOUS | Status: DC | PRN

## 2020-12-14 MED ORDER — ONDANSETRON HCL 4 MG/2ML IJ SOLN: 4.0000 mg | INTRAMUSCULAR | Status: DC | PRN

## 2020-12-14 MED ORDER — BENZOCAINE-MENTHOL 20-0.5 % EX AERO
1.0000 "application " | INHALATION_SPRAY | CUTANEOUS | Status: DC | PRN
  Administered 2020-12-14: 1 via TOPICAL
  Filled 2020-12-14: qty 56

## 2020-12-14 MED ORDER — OXYTOCIN BOLUS FROM INFUSION
333.0000 mL | Freq: Once | INTRAVENOUS | Status: AC
  Administered 2020-12-14: 333 mL via INTRAVENOUS

## 2020-12-14 MED ORDER — SENNOSIDES-DOCUSATE SODIUM 8.6-50 MG PO TABS
2.0000 | ORAL_TABLET | Freq: Every day | ORAL | Status: DC
  Administered 2020-12-15 – 2020-12-16 (×2): 2 via ORAL
  Filled 2020-12-14 (×2): qty 2

## 2020-12-14 MED ORDER — FENTANYL-BUPIVACAINE-NACL 0.5-0.125-0.9 MG/250ML-% EP SOLN
12.0000 mL/h | EPIDURAL | Status: DC | PRN
  Administered 2020-12-14: 12 mL/h via EPIDURAL
  Filled 2020-12-14: qty 250

## 2020-12-14 MED ORDER — OXYTOCIN-SODIUM CHLORIDE 30-0.9 UT/500ML-% IV SOLN
2.5000 [IU]/h | INTRAVENOUS | Status: DC
  Filled 2020-12-14: qty 500

## 2020-12-14 MED ORDER — SIMETHICONE 80 MG PO CHEW: 80.0000 mg | CHEWABLE_TABLET | ORAL | Status: DC | PRN

## 2020-12-14 MED ORDER — ARIPIPRAZOLE 2 MG PO TABS
2.0000 mg | ORAL_TABLET | Freq: Every day | ORAL | Status: DC
  Administered 2020-12-14 – 2020-12-16 (×3): 2 mg via ORAL
  Filled 2020-12-14 (×3): qty 1

## 2020-12-14 NOTE — Progress Notes (Signed)
Labor Progress Note  Martha Velasquez is a 20 y.o. female, G1P0000, IUP at 39.6 weeks, presenting for spontaneous latent labor. Pt endorse + Fm. Denies vaginal leakage. Denies vaginal bleeding. Teenage pregnancy (18). Bipolar disorder (restarted fluoxitine 20 mg daily). Asthma (no meds). Allergy to penicillin and ASA. GBS-. LR-Female, desires in pt circ. Late entry into [facility].   Subjective: Pt resting well now, comfortable with epidural, re discussed R/B/A of AROM and IUPC placement or pitocin to increase labor and need for it due to fetus has had occ deep variables noted throughout the night. Pt verbalized consent for AROM and IUPC placement only at this time.  Patient Active Problem List   Diagnosis Date Noted  . Normal labor and delivery 12/14/2020  . Supervision of low-risk pregnancy 06/02/2020   Objective: BP 124/86   Pulse 68   Temp 98.2 F (36.8 C) (Oral)   Resp 18   Ht 5\' 3"  (1.6 m)   Wt 83 kg   LMP 03/09/2020   SpO2 100%   BMI 32.42 kg/m  No intake/output data recorded. Total I/O In: -  Out: 800 [Urine:800] NST: FHR baseline 130 bpm, Variability: moderate, Accelerations:present, Decelerations:  Present variables occ to nadir of 60-80s with return to baseline= Cat 2/Reactive CTX:  irregular, every 3-8 minutes Uterus gravid, soft non tender, mild to palpate with contractions.  SVE:  Dilation: 7 Effacement (%): 80 Station: 0 Exam by:: [email or web address], CNM  AROM, clear, small amount, tolerated well IUPC placed with ease, MVU 60s  Assessment:  Martha Velasquez is a 20 y.o. female, G1P0000, IUP at 39.6 weeks, presenting for spontaneous latent labor. Pt endorse + Fm. Denies vaginal leakage. Denies vaginal bleeding. Teenage pregnancy (18). Bipolar disorder (restarted fluoxitine 20 mg daily). Asthma (no meds). Allergy to penicillin and ASA. GBS-. LR-Female, desires in pt circ. Late entry into [facility]. Pt comfortable post epidural, AROM and IUPC in place, pt having prodromal labor  now active.  Patient Active Problem List   Diagnosis Date Noted  . Normal labor and delivery 12/14/2020  . Supervision of low-risk pregnancy 06/02/2020   NICHD: Category 2  Category 3 with active intrauterine resuscitative measures maternal position change, fluid bolus, AROM,  and IUPC placement.   Membranes:  AROM, clear @ 0813 on 1/26, no s/s of infection  MVUs 60s, currently in prodromal labor which is inadequate, but pt making change  Induction:    Pitocin - Pt declined  Pain management:               IV pain management: x Fentanyl @ 0215, 0411             Epidural placement:  at 0522 on 1/26  GBS Negative  Plan: Continue labor plan Continuous monitoring Rest Frequent position changes to facilitate fetal rotation and descent. Will reassess with cervical exam at 3 hours or earlier if necessary Anticipate labor progression and vaginal delivery.  Anticipated birth time 11:17am   Md Dillard aware of plan and verbalized agreement.   2/26, NP-C, CNM, MSN 12/14/2020. 9:06 AM

## 2020-12-14 NOTE — Lactation Note (Signed)
This note was copied from a baby's chart. Lactation Consultation Note  Patient Name: Martha Velasquez [id number] Date: 12/14/2020   Age:21 hours  Parents request assistance with feeding.  Mom reports she does not know how to old him.  Reviewed holding at his ears and supporting his head and not pushing on his head.  Showed mom where her hand needed to be,.  Assisted with feeding on left breast in cross cradle hold. Infant latched well with rhythmic sucking and intermittent swallows. Came off.  Still cuing.  Urged mom to put him on other breast.     Reviewed Understanding Mother and Baby.  Urged to call lactation as needed. Maternal Data    Feeding Feeding Type: Breast Fed  [facility] Score                   Interventions    Lactation Tools Discussed/Used     Consult Status      Neomia Dear 12/14/2020, 6:25 PM

## 2020-12-14 NOTE — Lactation Note (Signed)
This note was copied from a baby's chart. Lactation Consultation Note  Patient Name: Martha Velasquez Today's Date: 12/14/2020   Age:21 hours  LC visited with [name] Martha Velasquez and parents at bedside in L and D.  Martha Velasquez rooting on arrival.  Mom reports she cant figure out how to old him to breastfeed.  LC assist mom in cross cradle hold on left breast.  Moms colostrum flows easily and nipple and breast are wet and Martha Velasquez has a hard time staying in place.  Slides off and starts sucking on breast/areola.  He did this a few times so urged mom to try and keep him in closer.  Tummy to mummy.  He is a Comptroller and mom has lots of colostrum.  Moms goal is too exclusively breastfeed.  Mom reports she does not have a pump at home.  Mom will be staying home with him.  Praised breastfeeding.  Left mom and Martha Velasquez breastfeeding with dad assisting.  LC to follow up with them later.   Maternal Data    Feeding    LATCH Score                   Interventions    Lactation Tools Discussed/Used     Consult Status      Martha Velasquez Martha Velasquez 12/14/2020, 1:44 PM

## 2020-12-14 NOTE — Anesthesia Procedure Notes (Signed)

## 2020-12-14 NOTE — Anesthesia Preprocedure Evaluation (Signed)
Anesthesia Evaluation  Patient identified by MRN, date of birth, ID band Patient awake    Reviewed: Allergy & Precautions, Patient's Chart, lab work & pertinent test results  Airway Mallampati: II  TM Distance: >3 FB Neck ROM: Full    Dental no notable dental hx. (+) Teeth Intact   Pulmonary asthma ,  Covid-19 +   Pulmonary exam normal breath sounds clear to auscultation       Cardiovascular negative cardio ROS Normal cardiovascular exam Rhythm:Regular Rate:Normal     Neuro/Psych PSYCHIATRIC DISORDERS Bipolar Disorder negative neurological ROS     GI/Hepatic negative GI ROS, Neg liver ROS,   Endo/Other  Obesity  Renal/GU negative Renal ROS  negative genitourinary   Musculoskeletal negative musculoskeletal ROS (+)   Abdominal (+) + obese,   Peds  Hematology  (+) anemia ,   Anesthesia Other Findings   Reproductive/Obstetrics (+) Pregnancy                             Anesthesia Physical Anesthesia Plan  ASA: II  Anesthesia Plan: Epidural   Post-op Pain Management:    Induction:   PONV Risk Score and Plan:   Airway Management Planned: Natural Airway  Additional Equipment:   Intra-op Plan:   Post-operative Plan:   Informed Consent: I have reviewed the patients History and Physical, chart, labs and discussed the procedure including the risks, benefits and alternatives for the proposed anesthesia with the patient or authorized representative who has indicated his/her understanding and acceptance.       Plan Discussed with: Anesthesiologist  Anesthesia Plan Comments:         Anesthesia Quick Evaluation

## 2020-12-15 LAB — CBC
HCT: 28.8 % — ABNORMAL LOW (ref 36.0–46.0)
Hemoglobin: 9.7 g/dL — ABNORMAL LOW (ref 12.0–15.0)
MCH: 28.5 pg (ref 26.0–34.0)
MCHC: 33.7 g/dL (ref 30.0–36.0)
MCV: 84.7 fL (ref 80.0–100.0)
Platelets: 302 10*3/uL (ref 150–400)
RBC: 3.4 MIL/uL — ABNORMAL LOW (ref 3.87–5.11)
RDW: 15.3 % (ref 11.5–15.5)
WBC: 21 10*3/uL — ABNORMAL HIGH (ref 4.0–10.5)
nRBC: 0 % (ref 0.0–0.2)

## 2020-12-15 MED ORDER — CYCLOBENZAPRINE HCL 5 MG PO TABS
10.0000 mg | ORAL_TABLET | Freq: Three times a day (TID) | ORAL | Status: DC | PRN
  Administered 2020-12-15 – 2020-12-16 (×2): 10 mg via ORAL
  Filled 2020-12-15 (×2): qty 2

## 2020-12-15 NOTE — Progress Notes (Signed)
PPD# 1 SVD w/ intact perineum Information for the patient's newborn:  Shameka, Aggarwal [[id number]]  female    Circumcision : wants in patient circ  S:   Reports feeling "ok". Baby is spitting up and it concerns her. She is taking ibuprofen and tylenol for pain. Reports this is helping her. Tolerating PO fluid and solids No nausea or vomiting Bleeding is light Pain controlled with acetaminophen and ibuprofen (OTC) Up ad lib / ambulatory / voiding w/o difficulty Feeding: Breast    O:   VS: BP 132/61 (BP Location: Left Arm)   Pulse 84   Temp 98.2 F (36.8 C) (Oral)   Resp 20   Ht 5\' 3"  (1.6 m)   Wt 83 kg   LMP 03/09/2020   SpO2 100%   Breastfeeding Unknown   BMI 32.42 kg/m   LABS:  Recent Labs    12/14/20 0011 12/15/20 0730  WBC 17.5* 21.0*  HGB 9.8* 9.7*  PLT 266 302   Blood type: --/--/O POS (01/25 2340) Rubella: Immune (07/26 0000)                      I&O: Intake/Output      01/26 0701 01/27 0700 01/27 0701 01/28 0700   Urine (mL/kg/hr) 1650 (0.8)    Blood 131    Total Output 1781    Net -1781         Urine Occurrence 1 x      Physical Exam: Alert and oriented X3 Lungs: Clear and unlabored Heart: regular rate and rhythm / no mumurs Abdomen: soft, non-tender, non-distended  Fundus: firm, non-tender Perineum: intact Lochia: minimal Extremities: negative edema, no calf pain or tenderness    A:  PPD # 1 Normal exam  P:  Routine post partum orders Bipolar disorder: started on Aripiprazole 2mg  on 12/14/20, continuing fluoxitine 20mg  Anticipate D/C on 12/16/20  Plan reviewed w/ Dr. 12/16/20, MSN, CNM 12/15/2020, 12:24 PM

## 2020-12-15 NOTE — Lactation Note (Signed)
This note was copied from a baby's chart. Lactation Consultation Note  Patient Name: Martha Velasquez ACZYS'A Date: 12/15/2020 Reason for consult: Follow-up assessment;Primapara;1st time breastfeeding;Term;Infant weight loss;Other (Comment) (2 % weight loss, tongir mobility challenges / see LC note for details) Age:21 hours  LC assessed the tongue moblity and noted the skin notch under the mid section of the tongue to be short. the skin notch under the upper lip just above the gum line, upper lip stretches well w/ assess + latched. indentation mid section of the tongue noted .  Per mom mentioned she had her released at age 69.  It was reported to [facility] baby had not been feeding well and also reported by Speech  Altamese [place] - baby had a tongue - tie.  Per mom the left nipple sore and the areola tender. LC assessed, no breakdown, areola compressible. 1st latched on the left for 5 mins and had to release latch to painful for mom. 2nd latch fitted for #20 NS and per mom felt better, but LC noted the baby was unable to seal even with help molding the breast tissue as the baby latched.  Mom mentioned her right nipple and areola were find to latch.  Positioned the baby in the football position, baby opened wide and latched with breast compressions, increased swallows noted and per mom comfortable.  Nipple well rounded when baby released.  Baby fed for total time 17 mins , released for less than 1 min and re-latched with assistance and better depth.  See doc flow sheets for details.  Mom receptive to teaching.   LC Plan :  Breast shells while awake between feedings except when sleeping for 10 mins prior to latch.  Use the football position for now with firm support and work with baby to open wide to latch with tea hold as shown until baby latches and move fingers back towards the areolas into a C - hold.  Spoon feed any EBM pumped off and post pump both breast after feedings, save milk.  Give the  left nipple a chance to become less tender.    Maternal Data Has patient been taught Hand Expression?: Yes  Feeding Feeding Type: Breast Milk  LATCH Score Latch: Repeated attempts needed to sustain latch, nipple held in mouth throughout feeding, stimulation needed to elicit sucking reflex.  Audible Swallowing: Spontaneous and intermittent  Type of Nipple: Everted at rest and after stimulation  Comfort (Breast/Nipple): Soft / non-tender  Hold (Positioning): Assistance needed to correctly position infant at breast and maintain latch.  LATCH Score: 8  Interventions Interventions: Assisted with latch;Breast feeding basics reviewed;Skin to skin;Breast massage;Hand express;Breast compression;Adjust position;Support pillows;Position options;Shells;Hand pump;DEBP  Lactation Tools Discussed/Used Tools: Shells;Pump;Flanges;Coconut oil Nipple shield size: 20 (baby unable to get a proper seal on the NS. per mom was more comfortable. LC recommended to switch to right breast that was not sore.) Flange Size: 24;27 (#24 F for the right and due to soreness #27 F if needed) Shell Type: Inverted Breast pump type: Manual;Double-Electric Breast Pump   Consult Status Consult Status: Follow-up Date: 12/16/20 Follow-up type: In-patient    Martha Velasquez 12/15/2020, 4:59 PM

## 2020-12-15 NOTE — Clinical Social Work Maternal (Signed)
CLINICAL SOCIAL WORK MATERNAL/CHILD NOTE  Patient Details  Name: Martha Velasquez MRN: [id number] Date of Birth: 07/22/2001  Date:  09/24/2021  Clinical Social Worker Initiating Note:  Manfred Arch, MSW, [place] Date/Time: Initiated:  12/15/20/0855     Child's Name:  Martha Velasquez   Biological Parents:  Mother,Father Leighton Parody [facility] 06/12/2001)   Need for Interpreter:  None   Reason for Referral:  Behavioral Health Concerns   Address:  [address] [place] [address]    Phone number:  [phone] (home)     Additional phone number:   Household Members/Support Persons (HM/SP):   Household Member/Support Person 1   HM/SP Name Relationship DOB or Age  HM/SP -1 Nolberto Cheuvront Significant Other 06/12/2000  HM/SP -2        HM/SP -3        HM/SP -4        HM/SP -5        HM/SP -6        HM/SP -7        HM/SP -8          Natural Supports (not living in the home):  Immediate Family   Professional Supports: None   Employment: Unemployed   Type of Work:     Education:  Some Materials engineer arranged:    Surveyor, quantity Resources:  Medicaid   Other Resources:      Cultural/Religious Considerations Which May Impact Care:    Strengths:  Ability to meet basic needs ,Psychotropic Medications,Home prepared for child ,Understanding of illness   Psychotropic Medications:  Prozac      Pediatrician:       Pediatrician List:   Radiographer, therapeutic    Tom Bean    [facility]      Pediatrician Fax Number:    Risk Factors/Current Problems:  Mental Health Concerns    Cognitive State:  Linear Thinking ,Insightful ,Alert ,Goal Oriented    Mood/Affect:  Happy ,Calm ,Bright ,Interested    CSW Assessment: CSW consulted for history of Bipolar. CSW contacted MOB by phone due to Covid positive status, to complete assessment and offer support. CSW introduced self and role. MOB was open to  speaking with CSW and pleasant during interaction. MOB reported she lives with FOB. MOB is currently unemployed and plans to sign up for [facility]. MOB reported she was diagnosed with Bipolar at the age of 36. MOB disclosed she experienced some depressive symptoms during the pregnancy, which she attributes to normal hormones. MOB stated she just restarted Fluoxetine 20 mg and plans to restart therapy postpartum. MOB reported she already has a therapy practice in mind. MOB expressed she is currently feeling good and denies any SI, HI or domestic violence. MOB stated her mother and FOB family are very supportive.   CSW provided education regarding the baby blues period vs. perinatal mood disorders, discussed treatment and provided resources for mental health follow up if concerns arise.  CSW recommended self-evaluation during the postpartum time period using the New Mom Checklist from Postpartum Progress and encouraged MOB to contact a medical professional if symptoms are noted at any time.   CSW provided review of Sudden Infant Death Syndrome (SIDS) precautions.  MOB reported she has all essential needs for baby, including a crib. MOB is still in the process of choosing a pediatrician and denies any barriers. MOB was receptive to receiving [facility] resources  and expressed no additional needs at this time.   CSW identifies no further need for intervention and no barriers to discharge at this time.   CSW Plan/Description:  No Further Intervention Required/No Barriers to Discharge,Perinatal Mood and Anxiety Disorder (PMADs) Education,Sudden Infant Death Syndrome (SIDS) Education,Other Information/Referral to Community Resources (Provided WIC information)    Apryle Stowell J Tynesia Harral, LCSWA 12/15/2020, 9:05 AM 

## 2020-12-15 NOTE — Anesthesia Postprocedure Evaluation (Signed)
Anesthesia Post Note  Patient: Airi Copado  Procedure(s) Performed: AN AD HOC LABOR EPIDURAL     Patient location during evaluation: Mother Baby Anesthesia Type: Epidural Level of consciousness: awake and alert Pain management: pain level controlled Vital Signs Assessment: post-procedure vital signs reviewed and stable Respiratory status: spontaneous breathing, nonlabored ventilation and respiratory function stable Cardiovascular status: stable Postop Assessment: no headache, no backache and epidural receding Anesthetic complications: no   No complications documented.  Last Vitals:  Vitals:   12/14/20 2322 12/15/20 0300  BP: 115/77 132/61  Pulse: 82 84  Resp: 18 20  Temp: 36.9 C 36.8 C  SpO2:      Last Pain:  Vitals:   12/15/20 0739  TempSrc:   PainSc: 4    Pain Goal: Patients Stated Pain Goal: 2 (12/15/20 0739)                 Junious Silk

## 2020-12-16 DIAGNOSIS — F3181 Bipolar II disorder: Secondary | ICD-10-CM | POA: Diagnosis present

## 2020-12-16 DIAGNOSIS — U071 COVID-19: Secondary | ICD-10-CM | POA: Diagnosis present

## 2020-12-16 DIAGNOSIS — D62 Acute posthemorrhagic anemia: Secondary | ICD-10-CM | POA: Diagnosis present

## 2020-12-16 MED ORDER — ARIPIPRAZOLE 2 MG PO TABS: 2.0000 mg | ORAL_TABLET | Freq: Every day | ORAL | 0 refills | Status: AC

## 2020-12-16 MED ORDER — IBUPROFEN 600 MG PO TABS: 600.0000 mg | ORAL_TABLET | Freq: Four times a day (QID) | ORAL | 0 refills | Status: AC

## 2020-12-16 MED ORDER — POLYSACCHARIDE IRON COMPLEX 150 MG PO CAPS
150.0000 mg | ORAL_CAPSULE | Freq: Every day | ORAL | Status: DC
  Administered 2020-12-16: 150 mg via ORAL
  Filled 2020-12-16: qty 1

## 2020-12-16 MED ORDER — POLYSACCHARIDE IRON COMPLEX 150 MG PO CAPS: 150.0000 mg | ORAL_CAPSULE | Freq: Every day | ORAL | 1 refills | Status: AC

## 2020-12-16 MED ORDER — FLUOXETINE HCL 20 MG PO CAPS: 20.0000 mg | ORAL_CAPSULE | Freq: Every day | ORAL | 1 refills | Status: AC

## 2020-12-16 NOTE — Discharge Summary (Signed)
SVD OB Discharge Summary     Patient Name: Martha Velasquez DOB: October 23, 2001 MRN: [id number]  Date of admission: 12/13/2020 Delivering MD: Dale [place]  Date of delivery: 12/14/2020 Type of delivery: SVD  Newborn Data: Sex: Baby Female Circumcision: In pt circ in pt room prior to being discharged today Live born female  Birth Weight: 6 lb 4.5 oz (2849 g) APGAR: 8, 9  Newborn Delivery   Birth date/time: 12/14/2020 12:27:00 Delivery type: Vaginal, Spontaneous      Feeding: breast Infant being discharge to home with mother in stable condition.   Admitting diagnosis: Normal labor and delivery [O80] Intrauterine pregnancy: [redacted]w[redacted]d     Secondary diagnosis:  Active Problems:   Normal labor and delivery   SVD (spontaneous vaginal delivery)   COVID-19   Normal postpartum course   Acute blood loss anemia   Bipolar 2 disorder (HCC)                                Complications: None                                                              Intrapartum Procedures: spontaneous vaginal delivery Postpartum Procedures: none Complications-Operative and Postpartum: none Augmentation: AROM   History of Present Illness: Ms. Martha Velasquez is a 20 y.o. female, G1P1001, who presents at [redacted]w[redacted]d weeks gestation. The patient has been followed at  [facility]  Her pregnancy has been complicated by:  Patient Active Problem List   Diagnosis Date Noted  . SVD (spontaneous vaginal delivery) 12/16/2020  . COVID-19 12/16/2020  . Normal postpartum course 12/16/2020  . Acute blood loss anemia 12/16/2020  . Bipolar 2 disorder (HCC) 12/16/2020  . Normal labor and delivery 12/14/2020  . Supervision of low-risk pregnancy 06/02/2020    Hospital course:  Onset of Labor With Vaginal Delivery      20 y.o. yo G1P1001 at [redacted]w[redacted]d was admitted in Latent Labor on 12/13/2020. Patient had an uncomplicated labor course as follows:  Membrane Rupture Time/Date: 8:13  AM ,12/14/2020   Delivery Method:Vaginal, Spontaneous  Episiotomy: None  Lacerations:  None  Patient had an uncomplicated postpartum course.  She is ambulating, tolerating a regular diet, passing flatus, and urinating well. Patient is discharged home in stable condition on 12/16/20.  Newborn Data: Birth date:12/14/2020  Birth time:12:27 PM  Gender:Female  Living status:Living  Apgars:8 ,9  Weight:2849 g  Postpartum Day # 2 : S/P NSVD due to spontaneous latent labor, progressed in prodromal labor and augmented with AROM, pt had SVD over intact perineum, ebl , with hgb drop of 9.8-9.7, asymptomatic anemia, started on PO iron PPD#2. H/O Bipolar pt on prozac 20mg  PO daily during pregnancy, please see delivery note for further details, due to increase risk of aviation of manic phase, pt started on abilify on PPD#0 2mg  PO daily, plan to increase in one to two weeks to 5mg  PO with [facility], pt requested referral to psych and referral placed, will f/ u in 1-2 weeks with [facility] for mood check, pt denies SI/HI, and so side effect felt from starting abilify.  Patient up ad lib, denies syncope or dizziness. Reports consuming regular diet without issues and denies  N/V. Patient reports 0 bowel movement + passing flatus.  Denies issues with urination and reports bleeding is "lighter."  Patient is breastfeeding and reports going well.  Desires nexplanon for postpartum contraception.  Pain is being appropriately managed with use of po meds.    Physical exam  Vitals:   12/15/20 0300 12/15/20 1519 12/15/20 2348 12/16/20 0527  BP: 132/61 (!) 132/59 122/70 122/70  Pulse: 84 75 65 72  Resp: 20 17 18 16   Temp: 98.2 F (36.8 C) 98.4 F (36.9 C) 98.4 F (36.9 C) 98.2 F (36.8 C)  TempSrc: Oral Oral Oral Oral  SpO2:      Weight:      Height:       General: alert, cooperative and no distress  Lochia: appropriate Uterine Fundus: firm Perineum: Intact DVT Evaluation: No evidence of DVT seen on physical  exam. Negative Homan's sign. No cords or calf tenderness. No significant calf/ankle edema.  Labs: Lab Results  Component Value Date   WBC 21.0 (H) 12/15/2020   HGB 9.7 (L) 12/15/2020   HCT 28.8 (L) 12/15/2020   MCV 84.7 12/15/2020   PLT 302 12/15/2020   CMP Latest Ref Rng & Units 04/11/2020  Glucose 70 - 99 mg/dL 84  BUN 6 - 20 mg/dL 10  Creatinine 04/13/2020 - 7.79 mg/dL 3.90  Sodium 3.00 - 923 mmol/L 136  Potassium 3.5 - 5.1 mmol/L 3.6  Chloride 98 - 111 mmol/L 106  CO2 22 - 32 mmol/L 18(L)  Calcium 8.9 - 10.3 mg/dL 9.2  Total Protein 6.5 - 8.1 g/dL 7.5  Total Bilirubin 0.3 - 1.2 mg/dL 0.9  Alkaline Phos 38 - 126 U/L 55  AST 15 - 41 U/L 20  ALT 0 - 44 U/L 13    Date of discharge: 12/16/2020 Discharge Diagnoses: Term Pregnancy-delivered Discharge instruction: per After Visit Summary and "Baby and Me Booklet".  After visit meds:   Activity:           unrestricted and pelvic rest Advance as tolerated. Pelvic rest for 6 weeks.  Diet:                routine Medications: PNV, Ibuprofen, Colace, Iron and prozac, abilify Postpartum contraception: Nexplanon Condition:  Pt discharge to home with baby in stable and condition Anemia: PO Iron, iron rich foods Bipolar: Previously dx, on fluoxetine 20mg  PO daily during pregnancy, added Abilify PP on 2mg  PO daily, pt verbalized will report increase SI/HI. F/U IN 1-2 weeks at [facility] for mood check and increase Abilify to 5mg  PO daily if tolerates. Referral placed to [facility] ambulatory psych for medication management and therapy per pt request, can go to [facility] or anywhere else if open spots available.    Meds: Allergies as of 12/16/2020      Reactions   Aspirin Hives   Penicillins Hives      Medication List    STOP taking these medications   promethazine 25 MG tablet Commonly known as: PHENERGAN     TAKE these medications   ARIPiprazole 2 MG tablet Commonly known as: ABILIFY Take 1 tablet (2 mg total) by mouth daily.    FLUoxetine 20 MG capsule Commonly known as: PROZAC Take 1 capsule (20 mg total) by mouth daily.   ibuprofen 600 MG tablet Commonly known as: ADVIL Take 1 tablet (600 mg total) by mouth every 6 (six) hours.   iron polysaccharides 150 MG capsule Commonly known as: NIFEREX Take 1 capsule (150 mg total) by mouth daily.  multivitamin-prenatal 27-0.8 MG Tabs tablet Take 1 tablet by mouth daily at 12 noon.       Discharge Follow Up:   Follow-up Information    [facility] Follow up in 2 day(s).   Specialty: Obstetrics and Gynecology Why: 1-2 weeks for mood check and increase abilify to 5mg  PO daily, and 6 weeks PPV for nexplanon placement. Contact information: [address] [place] [id number]               [place], NP-C, CNM 12/16/2020, 8:29 AM  12/18/2020, FNP

## 2021-02-05 IMAGING — US US OB TRANSVAGINAL
1 series · 14 of 28 positions shown · non-contrast
Comparison: None.

CLINICAL DATA: Pelvic pain, beta HCG 1,788

EXAM:
TRANSVAGINAL OB ULTRASOUND
TECHNIQUE: Transvaginal ultrasound was performed for complete evaluation of the
gestation as well as the maternal uterus, adnexal regions, and
pelvic cul-de-sac.

[Series 1: us ob transvaginal · 32 acquisitions, 14 frames shown]
[im 2/32]
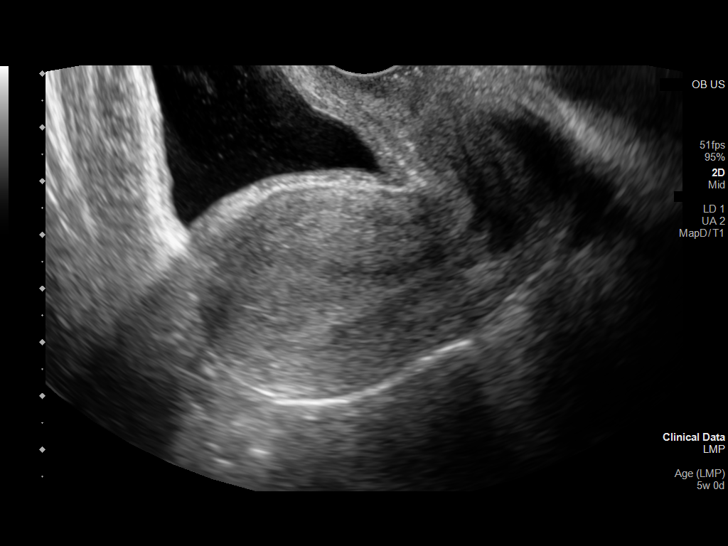
[im 4/32]
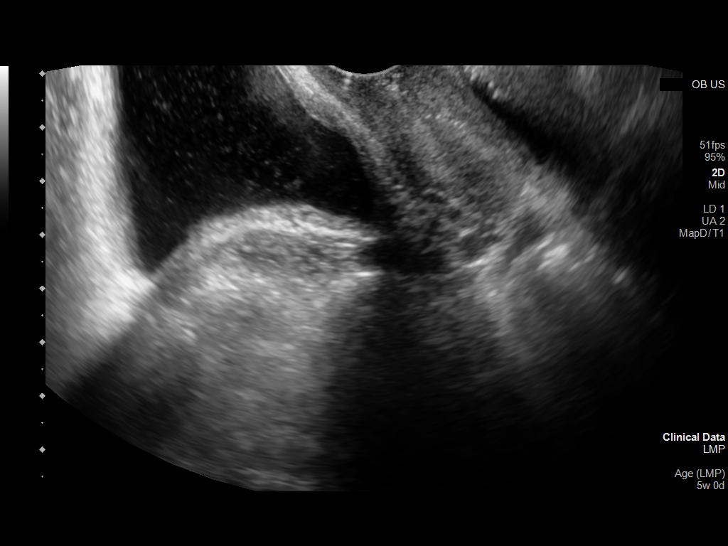
[im 6/32]
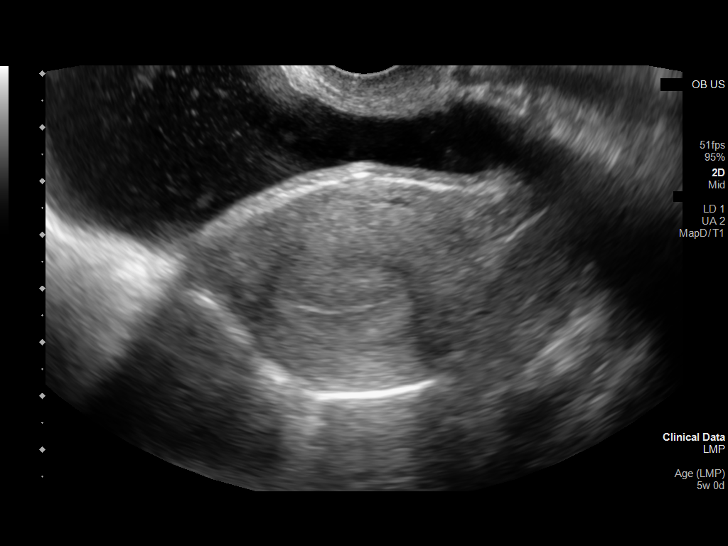
[im 9/32]
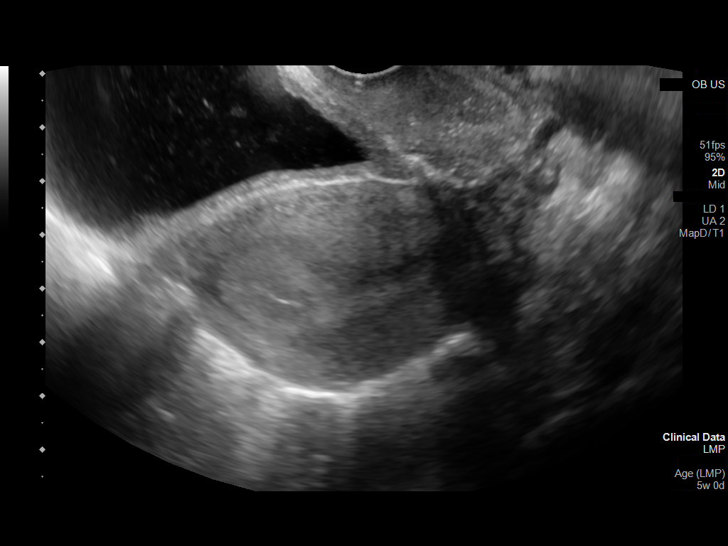
[im 11/32]
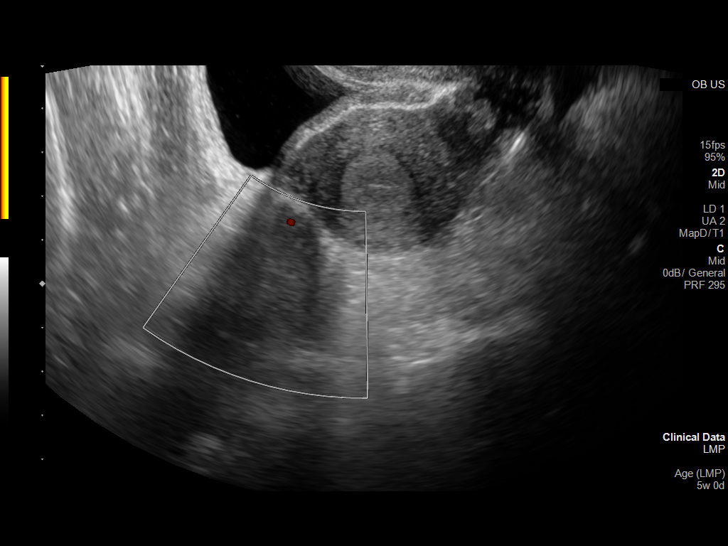
[im 13/32]
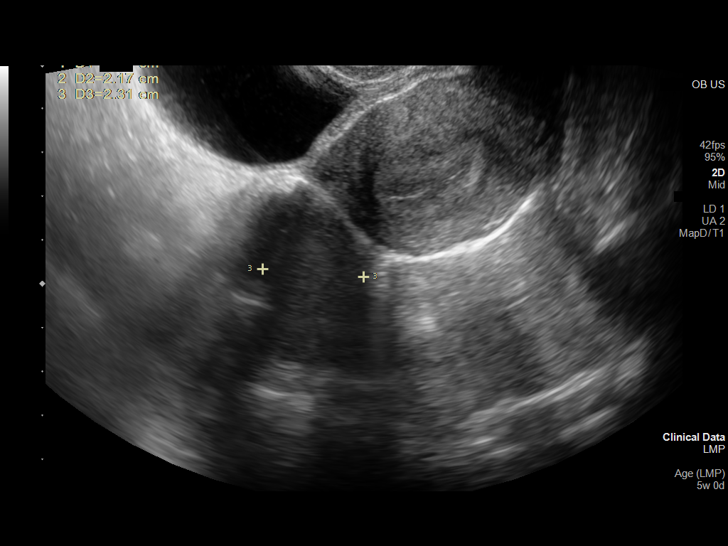
[im 15/32]
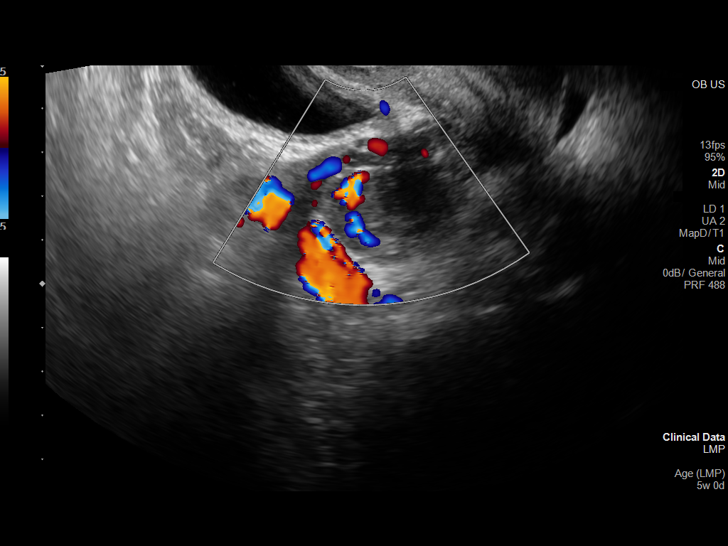
[im 18/32]
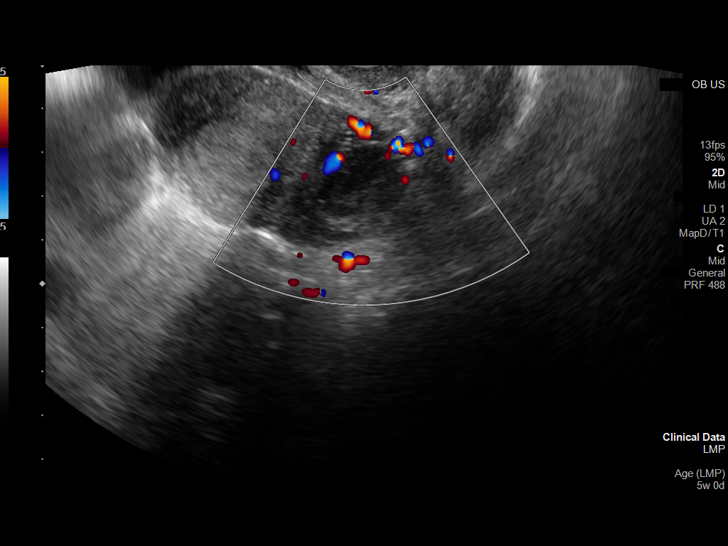
[im 20/32]
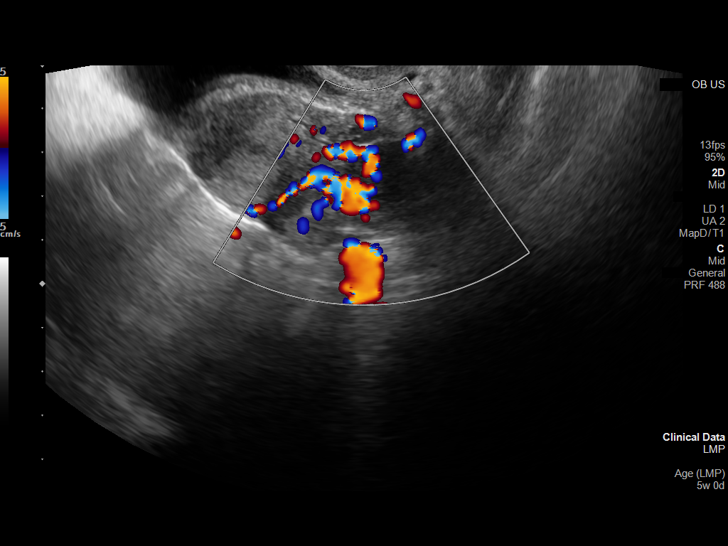
[im 22/32]
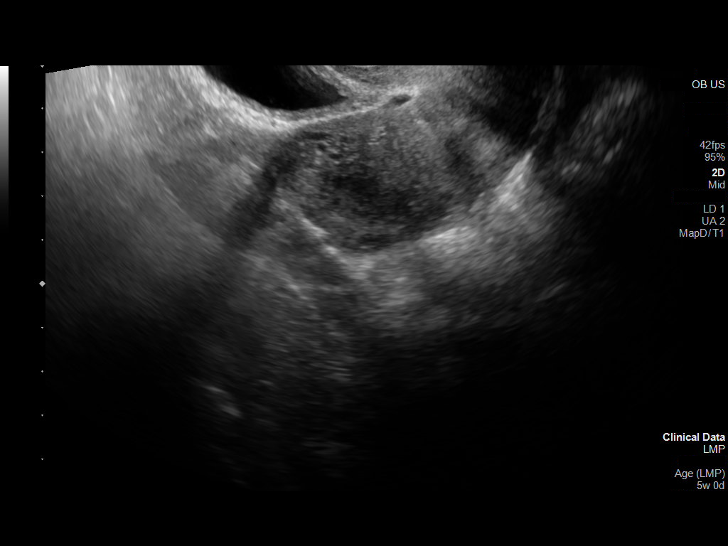
[im 25/32]
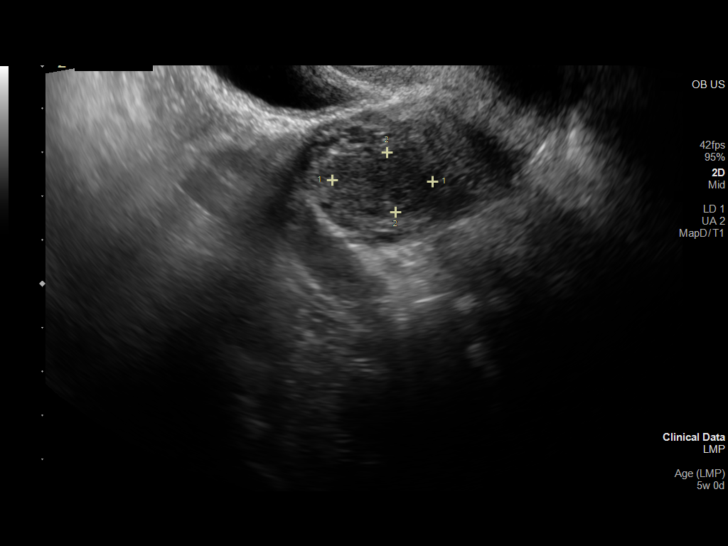
[im 27/32]
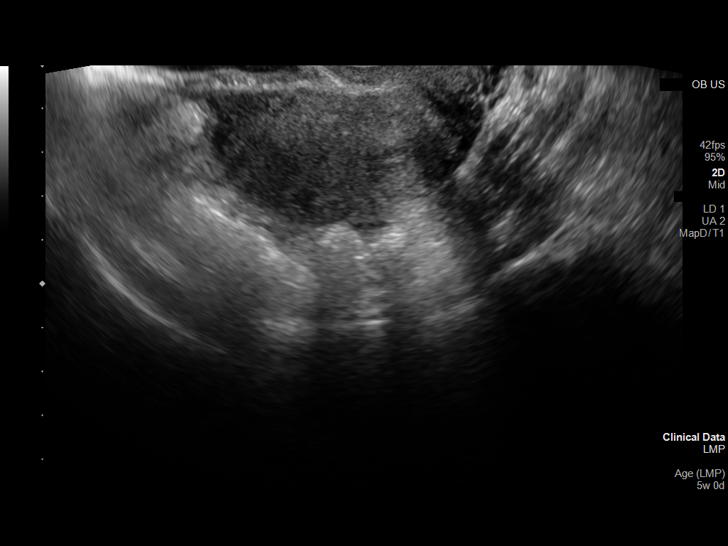
[im 29/32]
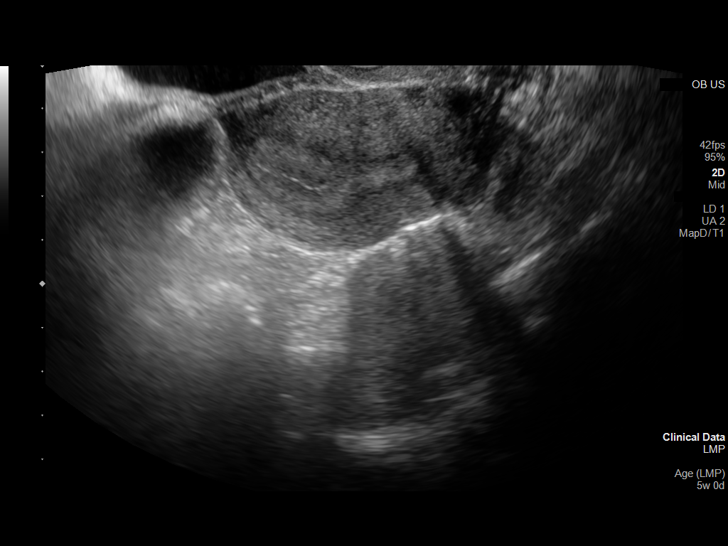
[im 32/32]
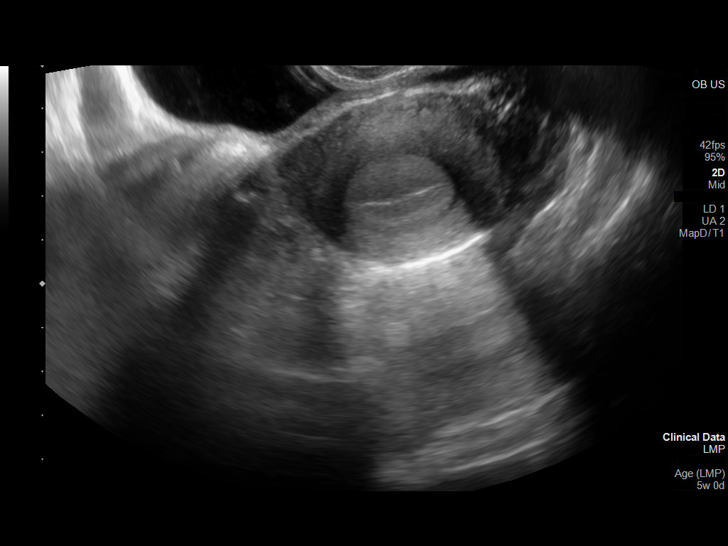

[14 of 28 positions shown; findings below may reference images not displayed]

FINDINGS: Intrauterine gestational sac: None

Yolk sac:  Not Visualized.

Embryo:  Not Visualized.

Maternal uterus/adnexae: Endometrium is thickened measuring 16 mm.

Right ovary measures 3.4 x 2.2 by 2.3 cm and the left ovary measures
4.7 x 2.8 by 3.3 cm. Within the left ovary there is a complex
hypoechoic structure measuring 1.9 by 2.3 x 1.4 cm, with increased
vascularity. While this likely reflects a hemorrhagic cyst, serial
beta HCG measurements and follow-up ultrasound will be needed to
document intrauterine pregnancy and exclude ectopic pregnancy.

No free fluid.
IMPRESSION: 1. No evidence of intrauterine pregnancy on this exam.
2. Hypervascular complex cystic structure within the left ovary,
likely a hemorrhagic cyst. However, serial beta HCG measurements and
follow-up ultrasound recommended to document intrauterine pregnancy
and exclude ectopic pregnancy.

## 2021-02-10 DIAGNOSIS — N8500 Endometrial hyperplasia, unspecified: Secondary | ICD-10-CM | POA: Diagnosis not present

## 2021-02-10 DIAGNOSIS — N930 Postcoital and contact bleeding: Secondary | ICD-10-CM | POA: Diagnosis not present

## 2021-02-10 DIAGNOSIS — R102 Pelvic and perineal pain: Secondary | ICD-10-CM | POA: Diagnosis not present

## 2021-02-10 DIAGNOSIS — N898 Other specified noninflammatory disorders of vagina: Secondary | ICD-10-CM | POA: Diagnosis not present

## 2021-02-10 DIAGNOSIS — F319 Bipolar disorder, unspecified: Secondary | ICD-10-CM | POA: Diagnosis not present

## 2021-02-10 DIAGNOSIS — Z3040 Encounter for surveillance of contraceptives, unspecified: Secondary | ICD-10-CM | POA: Diagnosis not present

## 2021-02-10 DIAGNOSIS — N921 Excessive and frequent menstruation with irregular cycle: Secondary | ICD-10-CM | POA: Diagnosis not present

## 2021-07-03 DIAGNOSIS — R6884 Jaw pain: Secondary | ICD-10-CM | POA: Diagnosis not present

## 2021-07-06 ENCOUNTER — Other Ambulatory Visit: Payer: Self-pay

## 2021-07-06 ENCOUNTER — Encounter (HOSPITAL_COMMUNITY): Payer: Self-pay

## 2021-07-06 ENCOUNTER — Emergency Department (HOSPITAL_COMMUNITY)
Admission: EM | Admit: 2021-07-06 | Discharge: 2021-07-06 | Disposition: A | Discharge status: Home / Self Care | Payer: Medicaid Other | Attending: Emergency Medicine | Admitting: Emergency Medicine

## 2021-07-06 DIAGNOSIS — H9201 Otalgia, right ear: Secondary | ICD-10-CM | POA: Diagnosis present

## 2021-07-06 DIAGNOSIS — H65194 Other acute nonsuppurative otitis media, recurrent, right ear: Secondary | ICD-10-CM | POA: Diagnosis not present

## 2021-07-06 DIAGNOSIS — J45909 Unspecified asthma, uncomplicated: Secondary | ICD-10-CM | POA: Diagnosis not present

## 2021-07-06 DIAGNOSIS — H6504 Acute serous otitis media, recurrent, right ear: Secondary | ICD-10-CM

## 2021-07-06 DIAGNOSIS — Z8616 Personal history of COVID-19: Secondary | ICD-10-CM | POA: Diagnosis not present

## 2021-07-06 DIAGNOSIS — H6501 Acute serous otitis media, right ear: Secondary | ICD-10-CM | POA: Diagnosis not present

## 2021-07-06 MED ORDER — HYDROCODONE-ACETAMINOPHEN 5-325 MG PO TABS: 1.0000 | ORAL_TABLET | Freq: Four times a day (QID) | ORAL | 0 refills | Status: AC | PRN

## 2021-07-06 MED ORDER — SULFAMETHOXAZOLE-TRIMETHOPRIM 800-160 MG PO TABS: 1.0000 | ORAL_TABLET | Freq: Two times a day (BID) | ORAL | 0 refills | Status: AC

## 2021-07-06 NOTE — ED Provider Notes (Signed)
[facility]GENCY DEPT Provider Note   CSN: [id number] Arrival date & time: 07/06/21  5361     History Chief Complaint  Patient presents with   Otalgia    Martha Velasquez is a 20 y.o. female.  Patient complains of right earache with decreased hearing.  She has been seen by a dentist who is referred her to an oral surgeon for TMJ problem.  Patient has been taken Naprosyn and Soma without relief  The history is provided by the patient and medical records. No language interpreter was used.  Otalgia Location:  Right Behind ear: Tenderness. Quality:  Aching Severity:  Moderate Onset quality:  Sudden Timing:  Constant Progression:  Worsening Chronicity:  New Relieved by:  Nothing Worsened by:  Nothing Ineffective treatments:  None tried Associated symptoms: no abdominal pain, no congestion, no cough, no diarrhea, no ear discharge, no headaches and no rash       Past Medical History:  Diagnosis Date   Asthma    Bipolar disease, chronic (HCC)    Ovarian cyst     Patient Active Problem List   Diagnosis Date Noted   SVD (spontaneous vaginal delivery) 12/16/2020   COVID-19 12/16/2020   Normal postpartum course 12/16/2020   Acute blood loss anemia 12/16/2020   Bipolar 2 disorder (HCC) 12/16/2020   Normal labor and delivery 12/14/2020   Supervision of low-risk pregnancy 06/02/2020    History reviewed. No pertinent surgical history.   OB History     Gravida  1   Para  1   Term  1   Preterm      AB      Living  1      SAB      IAB      Ectopic      Multiple  0   Live Births  1           Family History  Problem Relation Age of Onset   Healthy Mother    Bipolar disorder Father     Social History   Tobacco Use   Smoking status: Never   Smokeless tobacco: Never  Vaping Use   Vaping Use: Never used  Substance Use Topics   Alcohol use: Not Currently   Drug use: Not Currently    Home Medications Prior  to Admission medications   Medication Sig Start Date End Date Taking? Authorizing Provider  HYDROcodone-acetaminophen (NORCO/VICODIN) 5-325 MG tablet Take 1 tablet by mouth every 6 (six) hours as needed for moderate pain. 07/06/21  Yes Bethann Berkshire, MD  sulfamethoxazole-trimethoprim (BACTRIM DS) 800-160 MG tablet Take 1 tablet by mouth 2 (two) times daily for 7 days. 07/06/21 07/13/21 Yes Bethann Berkshire, MD  ARIPiprazole (ABILIFY) 2 MG tablet Take 1 tablet (2 mg total) by mouth daily. 12/16/20   Dale Magalia, FNP  FLUoxetine (PROZAC) 20 MG capsule Take 1 capsule (20 mg total) by mouth daily. 12/16/20   Dale Butner, FNP  ibuprofen (ADVIL) 600 MG tablet Take 1 tablet (600 mg total) by mouth every 6 (six) hours. 12/16/20   Dale [place], FNP  iron polysaccharides (NIFEREX) 150 MG capsule Take 1 capsule (150 mg total) by mouth daily. 12/16/20   Dale [place], FNP  Prenatal Vit-Fe Fumarate-FA (MULTIVITAMIN-PRENATAL) 27-0.8 MG TABS tablet Take 1 tablet by mouth daily at 12 noon.    [provider]    Allergies    Aspirin and Penicillins  Review of Systems   Review of Systems  Constitutional:  Negative  for appetite change and fatigue.  HENT:  Positive for ear pain. Negative for congestion, ear discharge and sinus pressure.   Eyes:  Negative for discharge.  Respiratory:  Negative for cough.   Cardiovascular:  Negative for chest pain.  Gastrointestinal:  Negative for abdominal pain and diarrhea.  Genitourinary:  Negative for frequency and hematuria.  Musculoskeletal:  Negative for back pain.  Skin:  Negative for rash.  Neurological:  Negative for seizures and headaches.  Psychiatric/Behavioral:  Negative for hallucinations.    Physical Exam Updated Vital Signs BP 117/66 (BP Location: Left Arm)   Pulse 82   Temp 98 F (36.7 C) (Oral)   Resp 16   Ht 5\' 2"  (1.575 m)   Wt 70.7 kg   LMP 06/10/2021 (Approximate)   SpO2 100%   BMI 28.52 kg/m   Physical Exam Vitals and nursing note  reviewed.  Constitutional:      Appearance: She is well-developed.  HENT:     Head: Normocephalic.     Comments: Left TM normal.  Right TM minor fluid seen.    Nose: Nose normal.  Eyes:     General: No scleral icterus.    Conjunctiva/sclera: Conjunctivae normal.  Neck:     Thyroid: No thyromegaly.  Cardiovascular:     Rate and Rhythm: Normal rate and regular rhythm.     Heart sounds: No murmur heard.   No friction rub. No gallop.  Pulmonary:     Breath sounds: No stridor. No wheezing or rales.  Chest:     Chest wall: No tenderness.  Abdominal:     General: There is no distension.     Tenderness: There is no abdominal tenderness. There is no rebound.  Musculoskeletal:        General: Normal range of motion.     Cervical back: Neck supple.  Lymphadenopathy:     Cervical: No cervical adenopathy.  Skin:    Findings: No erythema or rash.  Neurological:     Mental Status: She is alert and oriented to person, place, and time.     Motor: No abnormal muscle tone.     Coordination: Coordination normal.  Psychiatric:        Behavior: Behavior normal.    ED Results / Procedures / Treatments   Labs (all labs ordered are listed, but only abnormal results are displayed) Labs Reviewed - No data to display  EKG None  Radiology No results found.  Procedures Procedures   Medications Ordered in ED Medications - No data to display  ED Course  I have reviewed the triage vital signs and the nursing notes.  Pertinent labs & imaging results that were available during my care of the patient were reviewed by me and considered in my medical decision making (see chart for details).    MDM Rules/Calculators/A&P                           Patient with right earache and decreased hearing.  Possible infection she is placed on Bactrim and given pain medicine and will follow up with ENT Final Clinical Impression(s) / ED Diagnoses Final diagnoses:  Recurrent acute serous otitis media  of right ear    Rx / DC Orders ED Discharge Orders          Ordered    sulfamethoxazole-trimethoprim (BACTRIM DS) 800-160 MG tablet  2 times daily        07/06/21 0939    HYDROcodone-acetaminophen (  NORCO/VICODIN) 5-325 MG tablet  Every 6 hours PRN        07/06/21 0939             Bethann Berkshire, MD 07/06/21 [phone]

## 2021-07-06 NOTE — Discharge Instructions (Addendum)
Follow-up with Dr. Ezzard Standing in 1 to 2 weeks for continued earache and difficulty hearing

## 2021-07-06 NOTE — ED Triage Notes (Signed)
Patient states she went to a dentist for TMJ issues and was referred to the ED for an right earache that started yesterday.

## 2021-07-07 ENCOUNTER — Telehealth: Payer: Self-pay

## 2021-07-07 NOTE — Telephone Encounter (Signed)
Transition Care Management Unsuccessful Follow-up Telephone Call  Date of discharge and from where:  07/06/2021-[facility]   Attempts:  1st Attempt  Reason for unsuccessful TCM follow-up call:  Unable to reach patient    

## 2021-07-10 NOTE — Telephone Encounter (Signed)
Transition Care Management Unsuccessful Follow-up Telephone Call  Date of discharge and from where:  07/06/2021-[facility]   Attempts:  2nd Attempt  Reason for unsuccessful TCM follow-up call:  Unable to reach patient    

## 2021-07-11 NOTE — Telephone Encounter (Signed)
Transition Care Management Follow-up Telephone Call Date of discharge and from where: 07/07/2021 from [facility] How have you been since you were released from the hospital? Pt stated that the medication has helped. Pt stated that she has not made an appt with an oral surgeon just yet.  Any questions or concerns? No  Items Reviewed: Did the pt receive and understand the discharge instructions provided? Yes  Medications obtained and verified? Yes  Other? No  Any new allergies since your discharge? No  Dietary orders reviewed? No Do you have support at home? Yes   Functional Questionnaire: (I = Independent and D = Dependent) ADLs: I  Bathing/Dressing- I  Meal Prep- I  Eating- I  Maintaining continence- I  Transferring/Ambulation- I  Managing Meds- I   Follow up appointments reviewed:  PCP Hospital f/u appt confirmed? No   Specialist Hospital f/u appt confirmed? No   Are transportation arrangements needed? No  If their condition worsens, is the pt aware to call PCP or go to the Emergency Dept.? Yes Was the patient provided with contact information for the PCP's office or ED? Yes Was to pt encouraged to call back with questions or concerns? Yes

## 2021-08-10 ENCOUNTER — Emergency Department (HOSPITAL_COMMUNITY)
Admission: EM | Admit: 2021-08-10 | Discharge: 2021-08-11 | Disposition: A | Discharge status: Home / Self Care | Payer: Medicaid Other | Attending: Physician Assistant | Admitting: Physician Assistant

## 2021-08-10 ENCOUNTER — Other Ambulatory Visit: Payer: Self-pay

## 2021-08-10 ENCOUNTER — Encounter (HOSPITAL_COMMUNITY): Payer: Self-pay | Admitting: Emergency Medicine

## 2021-08-10 DIAGNOSIS — R519 Headache, unspecified: Secondary | ICD-10-CM | POA: Diagnosis not present

## 2021-08-10 DIAGNOSIS — Z5321 Procedure and treatment not carried out due to patient leaving prior to being seen by health care provider: Secondary | ICD-10-CM | POA: Insufficient documentation

## 2021-08-10 DIAGNOSIS — H538 Other visual disturbances: Secondary | ICD-10-CM | POA: Diagnosis not present

## 2021-08-10 DIAGNOSIS — Z79899 Other long term (current) drug therapy: Secondary | ICD-10-CM | POA: Diagnosis not present

## 2021-08-10 DIAGNOSIS — R42 Dizziness and giddiness: Secondary | ICD-10-CM | POA: Diagnosis not present

## 2021-08-10 DIAGNOSIS — F0781 Postconcussional syndrome: Secondary | ICD-10-CM | POA: Diagnosis not present

## 2021-08-10 DIAGNOSIS — R55 Syncope and collapse: Secondary | ICD-10-CM | POA: Diagnosis not present

## 2021-08-10 NOTE — ED Provider Notes (Signed)
Emergency Medicine Provider Triage Evaluation Note  Martha Velasquez , a 20 y.o. female  was evaluated in triage.  Pt brought to the emergency department with her boyfriend complaining of multiple, recurrent episodes of syncope onset approximately 20 minutes prior to arrival.  I witnessed 1 of these episodes.  Patient becomes limp, her head hangs back and she is flutters her eyes.  She is immediately alert and oriented afterwards including ability to talk.  States she does not remember these episodes and is not sure what is happening.  Reports 1 glass of wine earlier in the evening but denies drug usage.  Denies any recent stress in her life, but states that she does feel tired.  Review of Systems  Positive: Headache, fatigue, syncope Negative: Urinary incontinence  Physical Exam  BP 127/70   Pulse 81   Temp 98.7 F (37.1 C) (Oral)   Resp 18   SpO2 100%  Gen:   Awake, no distress   Resp:  Normal effort  MSK:   Moves extremities without difficulty  Other:  Alert and oriented immediately after incident  Medical Decision Making  Medically screening exam initiated at 11:52 PM.  Appropriate orders placed.  Martha Velasquez was informed that the remainder of the evaluation will be completed by another provider, this initial triage assessment does not replace that evaluation, and the importance of remaining in the ED until their evaluation is complete.  Reported syncope.  Patient also with headache.  Labs and imaging pending.   Martha Velasquez, Martha Velasquez 08/10/21 2356    Palumbo, April, MD 08/11/21 [id number]

## 2021-08-10 NOTE — ED Triage Notes (Signed)
Patient reports headache and mild right eye blurred vision onset this morning , patient added that her boyfriend noticed her eyes are rolled back this evening .

## 2021-08-11 ENCOUNTER — Emergency Department (HOSPITAL_COMMUNITY): Payer: Medicaid Other

## 2021-08-11 DIAGNOSIS — I498 Other specified cardiac arrhythmias: Secondary | ICD-10-CM | POA: Diagnosis not present

## 2021-08-11 DIAGNOSIS — R42 Dizziness and giddiness: Secondary | ICD-10-CM | POA: Diagnosis not present

## 2021-08-11 DIAGNOSIS — R55 Syncope and collapse: Secondary | ICD-10-CM | POA: Diagnosis not present

## 2021-08-11 DIAGNOSIS — F0781 Postconcussional syndrome: Secondary | ICD-10-CM | POA: Diagnosis not present

## 2021-08-11 LAB — CBC WITH DIFFERENTIAL/PLATELET
Abs Immature Granulocytes: 0.07 10*3/uL (ref 0.00–0.07)
Basophils Absolute: 0 10*3/uL (ref 0.0–0.1)
Basophils Relative: 0 %
Eosinophils Absolute: 0 10*3/uL (ref 0.0–0.5)
Eosinophils Relative: 0 %
HCT: 36.1 % (ref 36.0–46.0)
Hemoglobin: 11.9 g/dL — ABNORMAL LOW (ref 12.0–15.0)
Immature Granulocytes: 1 %
Lymphocytes Relative: 14 %
Lymphs Abs: 1.6 10*3/uL (ref 0.7–4.0)
MCH: 28.3 pg (ref 26.0–34.0)
MCHC: 33 g/dL (ref 30.0–36.0)
MCV: 85.7 fL (ref 80.0–100.0)
Monocytes Absolute: 0.7 10*3/uL (ref 0.1–1.0)
Monocytes Relative: 7 %
Neutro Abs: 8.4 10*3/uL — ABNORMAL HIGH (ref 1.7–7.7)
Neutrophils Relative %: 78 %
Platelets: 282 10*3/uL (ref 150–400)
RBC: 4.21 MIL/uL (ref 3.87–5.11)
RDW: 14.6 % (ref 11.5–15.5)
WBC: 10.8 10*3/uL — ABNORMAL HIGH (ref 4.0–10.5)
nRBC: 0 % (ref 0.0–0.2)

## 2021-08-11 LAB — URINALYSIS, ROUTINE W REFLEX MICROSCOPIC
Bilirubin Urine: NEGATIVE
Glucose, UA: NEGATIVE mg/dL
Ketones, ur: 20 mg/dL — AB
Leukocytes,Ua: NEGATIVE
Nitrite: NEGATIVE
Protein, ur: 30 mg/dL — AB
Specific Gravity, Urine: 1.023 (ref 1.005–1.030)
pH: 5 (ref 5.0–8.0)

## 2021-08-11 LAB — RAPID URINE DRUG SCREEN, HOSP PERFORMED
Amphetamines: NOT DETECTED
Barbiturates: NOT DETECTED
Benzodiazepines: NOT DETECTED
Cocaine: NOT DETECTED
Opiates: NOT DETECTED
Tetrahydrocannabinol: POSITIVE — AB

## 2021-08-11 LAB — COMPREHENSIVE METABOLIC PANEL
ALT: 16 U/L (ref 0–44)
AST: 21 U/L (ref 15–41)
Albumin: 4.1 g/dL (ref 3.5–5.0)
Alkaline Phosphatase: 62 U/L (ref 38–126)
Anion gap: 9 (ref 5–15)
BUN: 5 mg/dL — ABNORMAL LOW (ref 6–20)
CO2: 20 mmol/L — ABNORMAL LOW (ref 22–32)
Calcium: 9.4 mg/dL (ref 8.9–10.3)
Chloride: 108 mmol/L (ref 98–111)
Creatinine, Ser: 0.68 mg/dL (ref 0.44–1.00)
GFR, Estimated: >60 mL/min (ref >60)
Glucose, Bld: 87 mg/dL (ref 70–99)
Potassium: 3.7 mmol/L (ref 3.5–5.1)
Sodium: 137 mmol/L (ref 135–145)
Total Bilirubin: 1.2 mg/dL (ref 0.3–1.2)
Total Protein: 6.6 g/dL (ref 6.5–8.1)

## 2021-08-11 LAB — I-STAT BETA HCG BLOOD, ED (MC, WL, AP ONLY): I-stat hCG, quantitative: <5 m[IU]/mL (ref <5)

## 2021-08-11 LAB — ETHANOL: Alcohol, Ethyl (B): <10 mg/dL (ref <10)

## 2021-08-11 NOTE — ED Notes (Addendum)
Pt left due to wait time  

## 2021-08-14 ENCOUNTER — Telehealth: Payer: Self-pay

## 2021-08-14 NOTE — Telephone Encounter (Signed)
Transition Care Management Unsuccessful Follow-up Telephone Call  Date of discharge and from where:  08/11/2021 from [facility]  Attempts:  1st Attempt  Reason for unsuccessful TCM follow-up call:  Voice mail full

## 2021-08-21 NOTE — Telephone Encounter (Signed)
Transition Care Management Unsuccessful Follow-up Telephone Call  Date of discharge and from where:  08/11/2021[facility]   Attempts:  2nd Attempt  Reason for unsuccessful TCM follow-up call:  Voice mail full

## 2021-08-22 NOTE — Telephone Encounter (Signed)
Transition Care Management Unsuccessful Follow-up Telephone Call  Date of discharge and from where:  08/11/2021 from [facility]  Attempts:  3rd Attempt  Reason for unsuccessful TCM follow-up call:  Unable to reach patient

## 2021-08-25 DIAGNOSIS — F411 Generalized anxiety disorder: Secondary | ICD-10-CM | POA: Diagnosis not present

## 2021-08-25 DIAGNOSIS — F332 Major depressive disorder, recurrent severe without psychotic features: Secondary | ICD-10-CM | POA: Diagnosis not present

## 2022-06-07 IMAGING — CT CT HEAD W/O CM
4 series · 17 of 47 positions shown, 19 images · non-contrast
Comparison: None.

CLINICAL DATA: Syncope.

EXAM:
CT HEAD WITHOUT CONTRAST
TECHNIQUE: Contiguous axial images were obtained from the base of the skull
through the vertex without intravenous contrast.

[Series 2: head wo · axial · 0.40mm/px · z∈[+1130,+1234]mm · 7 of 29 slices shown, 9 images]
[im 4/29  brain]
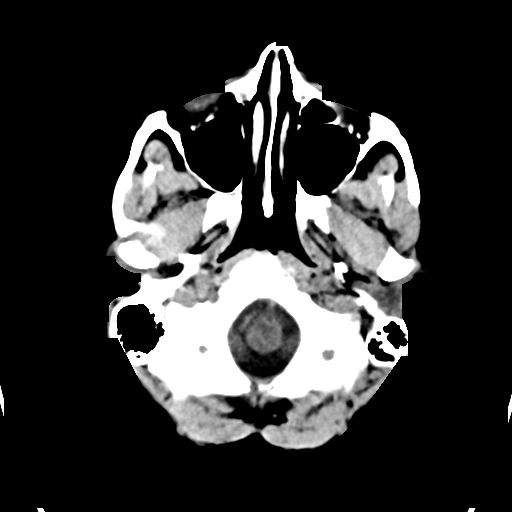
[im 4/29  bone]
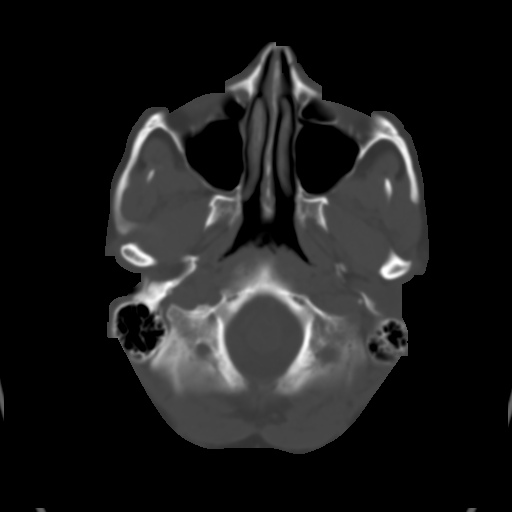
[im 8/29  brain]
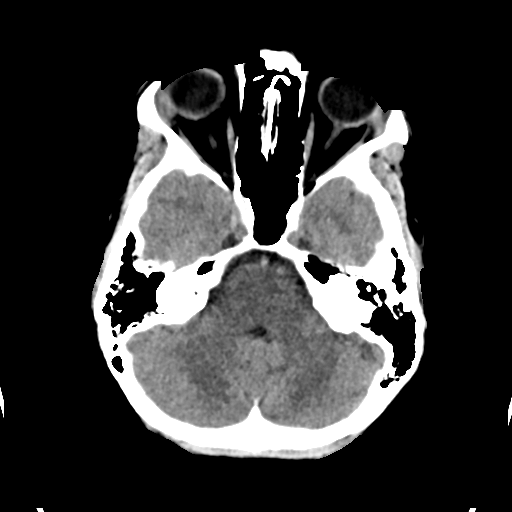
[im 11/29  brain]
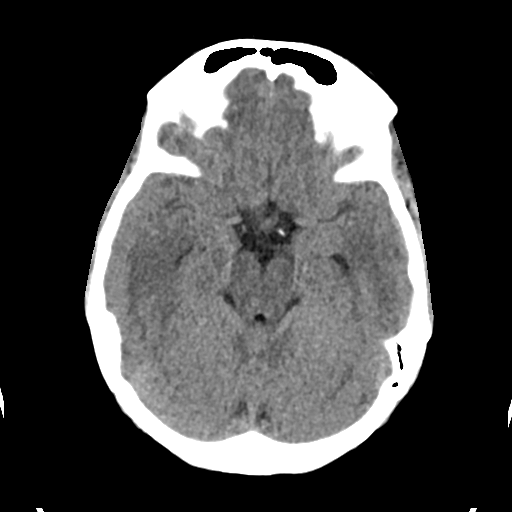
[im 15/29  brain]
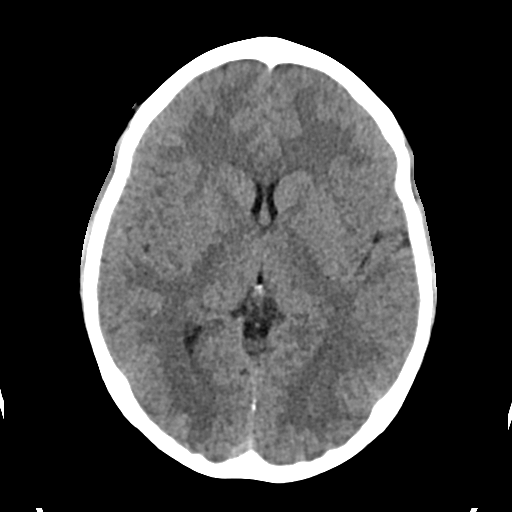
[im 18/29  brain]
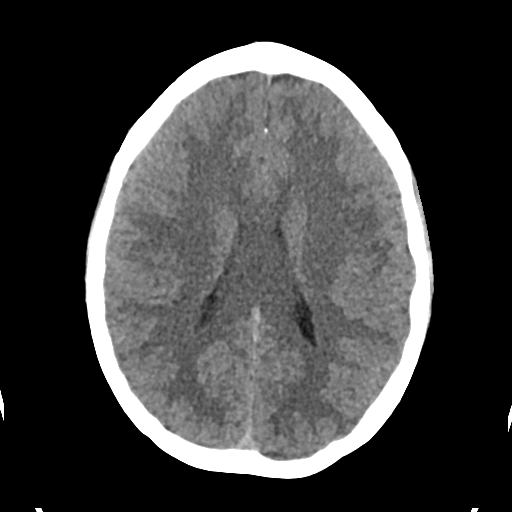
[im 18/29  bone]
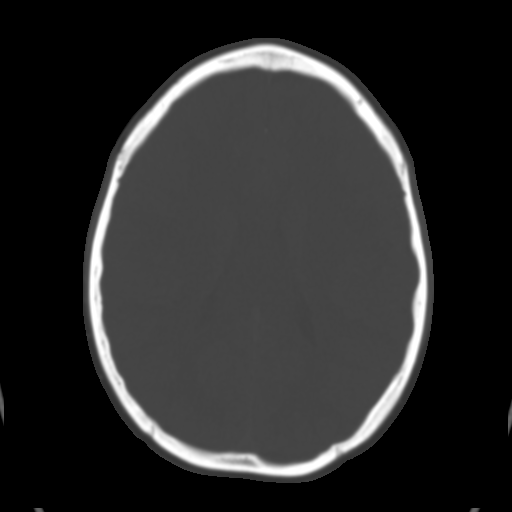
[im 22/29  brain]
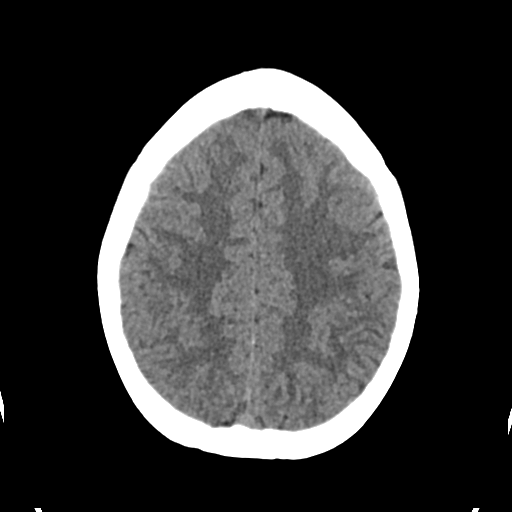
[im 25/29  brain]
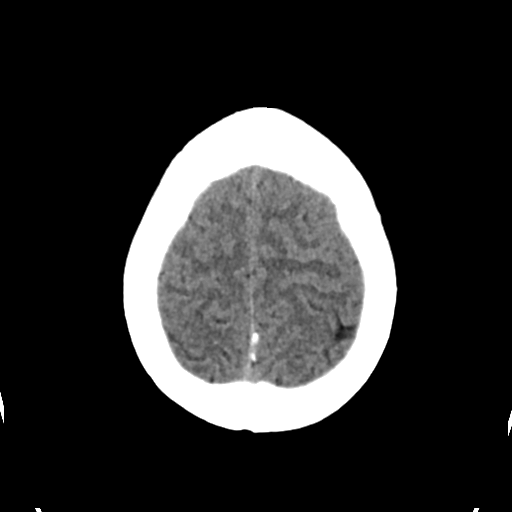

[Series 3: head bone · axial · 0.40mm/px · z∈[+1128,+1176]mm · 4 of 72 slices shown]
[im 8/72  bone]
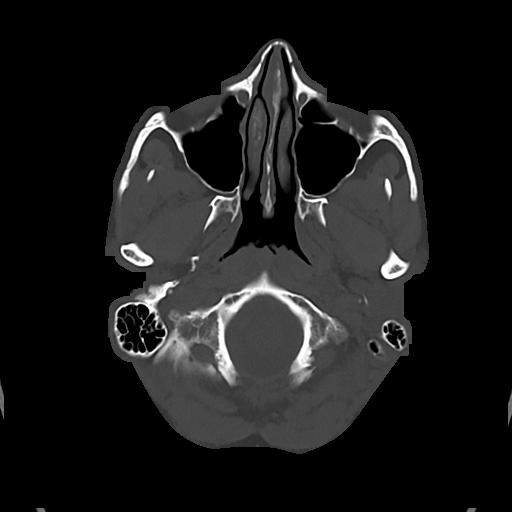
[im 15/72  bone]
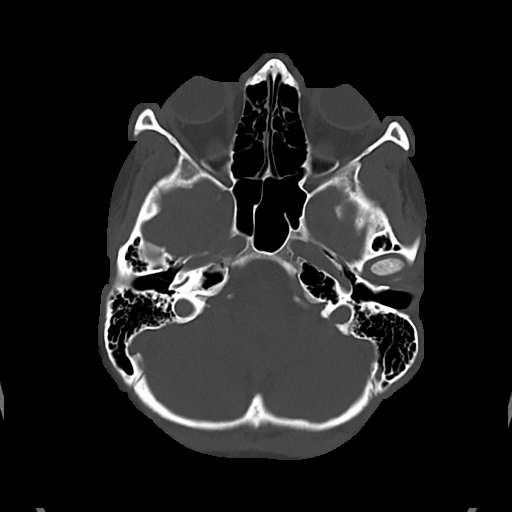
[im 22/72  bone]
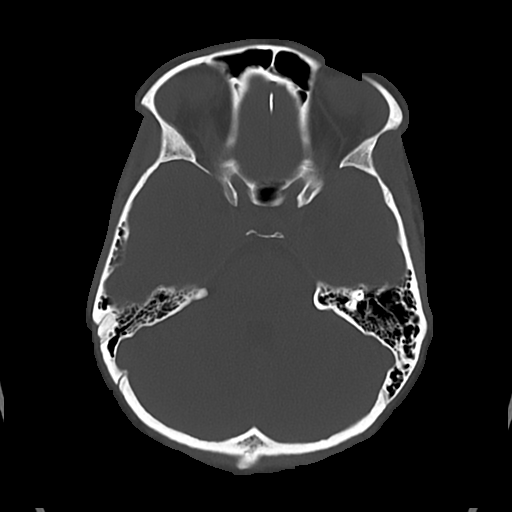
[im 32/72  bone]
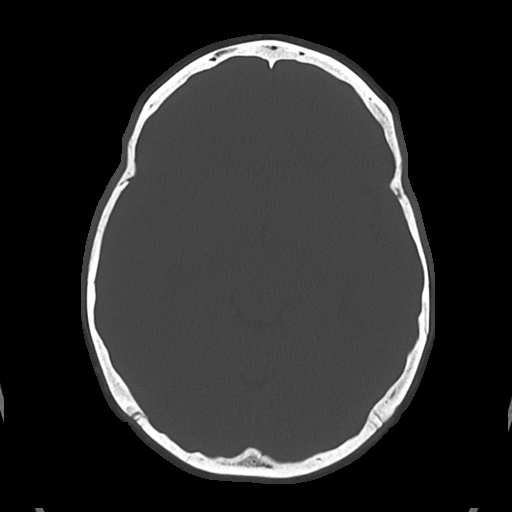

[Series 4: cor soft · coronal · 0.31mm/px · 3 of 67 slices shown]
[im 23/67  brain]
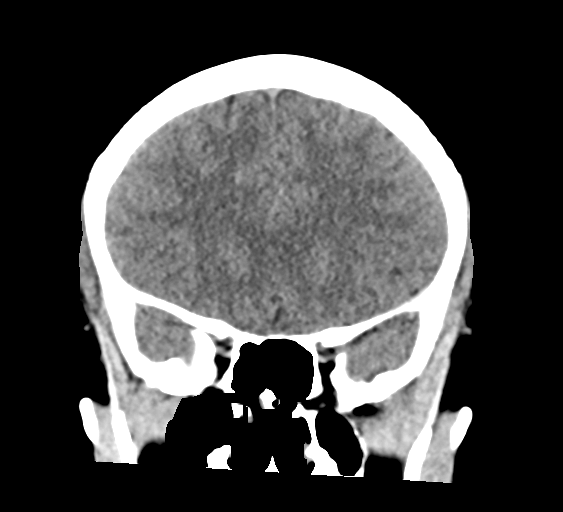
[im 30/67  brain]
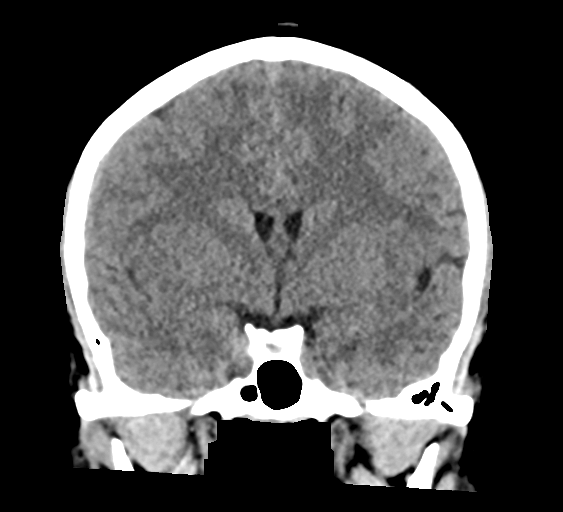
[im 37/67  brain]
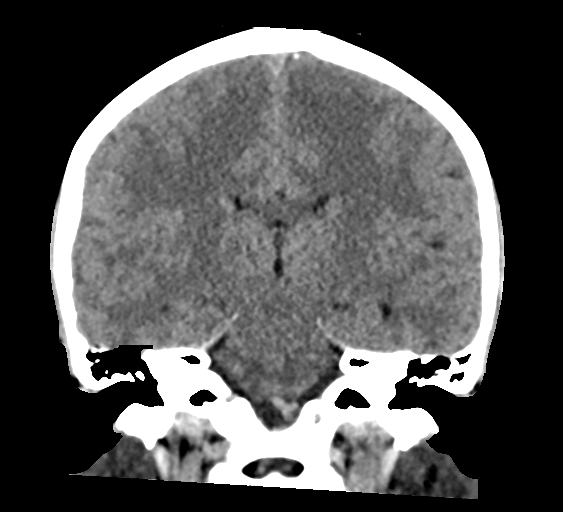

[Series 5: sag soft · sagittal · 0.30mm/px · 3 of 59 slices shown]
[im 20/59  brain]
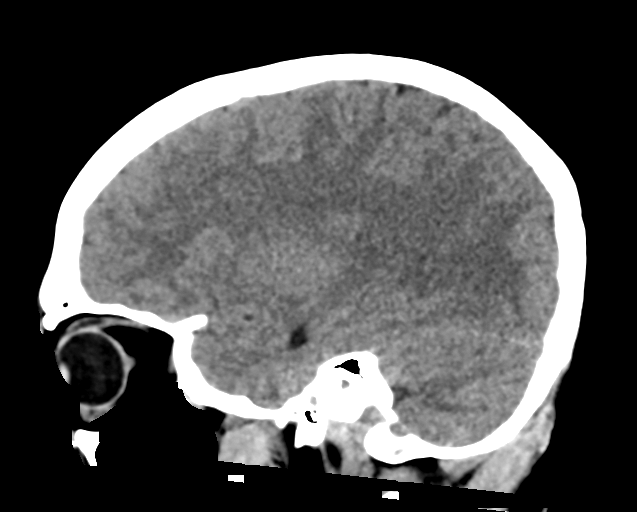
[im 30/59  brain]
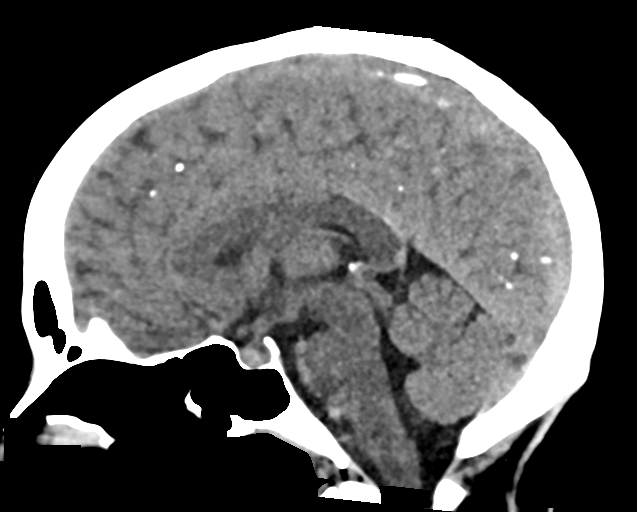
[im 39/59  brain]
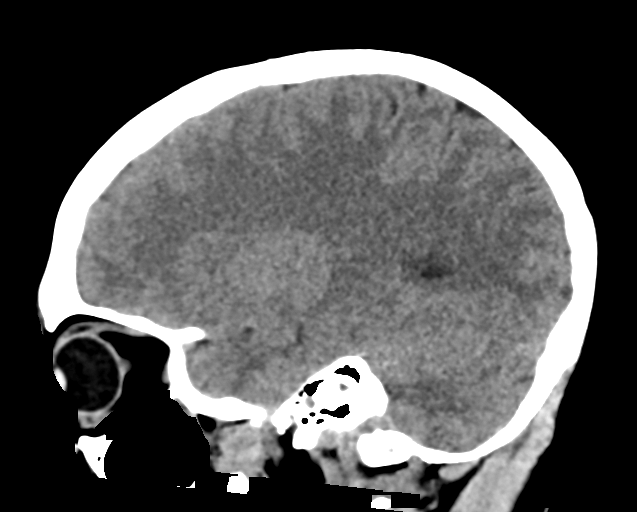

[17 of 47 positions shown; findings below may reference images not displayed]

FINDINGS: Brain: No evidence of acute infarction, hemorrhage, hydrocephalus,
extra-axial collection or mass lesion/mass effect.

Vascular: No hyperdense vessel or unexpected calcification.

Skull: Normal. Negative for fracture or focal lesion.

Sinuses/Orbits: No acute finding.

Other: None.
IMPRESSION: No acute intracranial pathology.

## 2022-08-29 ENCOUNTER — Emergency Department (HOSPITAL_COMMUNITY)
Admission: EM | Admit: 2022-08-29 | Discharge: 2022-08-29 | Discharge status: Against Medical Advice | Payer: Medicaid Other

## 2022-08-29 NOTE — ED Notes (Signed)
Called x 3 for triage and no response. Pt cannot be found in waiting room or bathroom.

## 2022-12-03 ENCOUNTER — Inpatient Hospital Stay: Admission: RE | Admit: 2022-12-03 | Payer: Self-pay | Source: Ambulatory Visit

## 2023-06-02 ENCOUNTER — Other Ambulatory Visit: Payer: Self-pay

## 2023-06-02 ENCOUNTER — Emergency Department (HOSPITAL_COMMUNITY)
Admission: EM | Admit: 2023-06-02 | Discharge: 2023-06-02 | Disposition: A | Discharge status: Home / Self Care | Payer: BC Managed Care – PPO | Attending: Emergency Medicine | Admitting: Emergency Medicine

## 2023-06-02 ENCOUNTER — Encounter (HOSPITAL_COMMUNITY): Payer: Self-pay | Admitting: *Deleted

## 2023-06-02 DIAGNOSIS — S61213A Laceration without foreign body of left middle finger without damage to nail, initial encounter: Secondary | ICD-10-CM | POA: Insufficient documentation

## 2023-06-02 DIAGNOSIS — S61211A Laceration without foreign body of left index finger without damage to nail, initial encounter: Secondary | ICD-10-CM | POA: Diagnosis present

## 2023-06-02 DIAGNOSIS — W260XXA Contact with knife, initial encounter: Secondary | ICD-10-CM | POA: Insufficient documentation

## 2023-06-02 MED ORDER — BUPIVACAINE HCL (PF) 0.5 % IJ SOLN
10.0000 mL | Freq: Once | INTRAMUSCULAR | Status: DC
  Filled 2023-06-02: qty 10

## 2023-06-02 MED ORDER — MIDAZOLAM HCL 2 MG/2ML IJ SOLN
2.0000 mg | Freq: Once | INTRAMUSCULAR | Status: AC
  Administered 2023-06-02: 2 mg via INTRAMUSCULAR
  Filled 2023-06-02: qty 2

## 2023-06-02 MED ORDER — HYDROCODONE-ACETAMINOPHEN 5-325 MG PO TABS
1.0000 | ORAL_TABLET | Freq: Once | ORAL | Status: AC
  Administered 2023-06-02: 1 via ORAL
  Filled 2023-06-02: qty 1

## 2023-06-02 NOTE — ED Provider Notes (Signed)
[facility] EMERGENCY DEPARTMENT AT [facility] Provider Note   CSN: [id number] Arrival date & time: 06/02/23  0135     History  Chief Complaint  Patient presents with   Laceration    Martha Velasquez is a 22 y.o. female.  Patient presents to the emergency department with a chief complaint of laceration.  Patient was using a sharp butcher's knife to cut tomatoes and sliced the anterior proximal index and long finger of the left hand.  Patient is right-hand dominant and works as a Investment banker, operational.  Bleeding was controlled upon arrival.  Past medical history significant for bipolar 2 disorder, patient on Seroquel  HPI     Home Medications Prior to Admission medications   Medication Sig Start Date End Date Taking? Authorizing Provider  ARIPiprazole (ABILIFY) 2 MG tablet Take 1 tablet (2 mg total) by mouth daily. 12/16/20   Dale Ramseur, FNP  FLUoxetine (PROZAC) 20 MG capsule Take 1 capsule (20 mg total) by mouth daily. 12/16/20   Dale [place], FNP  HYDROcodone-acetaminophen (NORCO/VICODIN) 5-325 MG tablet Take 1 tablet by mouth every 6 (six) hours as needed for moderate pain. 07/06/21   Bethann Berkshire, MD  ibuprofen (ADVIL) 600 MG tablet Take 1 tablet (600 mg total) by mouth every 6 (six) hours. 12/16/20   Dale [place], FNP  iron polysaccharides (NIFEREX) 150 MG capsule Take 1 capsule (150 mg total) by mouth daily. 12/16/20   Dale Hazel, FNP  Prenatal Vit-Fe Fumarate-FA (MULTIVITAMIN-PRENATAL) 27-0.8 MG TABS tablet Take 1 tablet by mouth daily at 12 noon.    [provider]      Allergies    Aspirin and Penicillins    Review of Systems   Review of Systems  Physical Exam Updated Vital Signs BP 103/66   Pulse 93   Temp 98.3 F (36.8 C)   Resp 20   Ht 5\' 2"  (1.575 m)   Wt 72 kg   LMP 04/28/2023   SpO2 100%   BMI 29.03 kg/m  Physical Exam Vitals and nursing note reviewed.  HENT:     Head: Normocephalic and atraumatic.  Eyes:     Pupils: Pupils are  equal, round, and reactive to light.  Pulmonary:     Effort: Pulmonary effort is normal. No respiratory distress.  Musculoskeletal:        General: No signs of injury.     Cervical back: Normal range of motion.  Skin:    General: Skin is dry.     Comments: Lacerations noted to anterior index and long fingers of left hand. Small laceration, not bleeding, noted to anterior left ring finger  Neurological:     Mental Status: She is alert.  Psychiatric:        Speech: Speech normal.     Comments: Patient is anxious     ED Results / Procedures / Treatments   Labs (all labs ordered are listed, but only abnormal results are displayed) Labs Reviewed - No data to display  EKG None  Radiology No results found.  Procedures .Marland KitchenLaceration Repair  Date/Time: 06/02/2023 4:05 AM  Performed by: Darrick Grinder, PA-C Authorized by: Darrick Grinder, PA-C   Consent:    Consent obtained:  Verbal   Consent given by:  Patient   Risks, benefits, and alternatives were discussed: yes     Risks discussed:  Pain, poor cosmetic result, poor wound healing, nerve damage and need for additional repair Universal protocol:    Patient identity confirmed:  Verbally  with patient Anesthesia:    Anesthesia method:  Local infiltration and nerve block   Local anesthetic:  Bupivacaine 0.5% w/o epi   Block location:  Digital block   Block needle gauge:  25 G   Block anesthetic:  Bupivacaine 0.5% w/o epi   Block technique:  Ring block   Block injection procedure:  Anatomic landmarks identified, introduced needle, incremental injection and negative aspiration for blood   Block outcome:  Incomplete block Laceration details:    Location:  Finger   Finger location:  L index finger   Length (cm):  2   Depth (mm):  3 Pre-procedure details:    Preparation:  Patient was prepped and draped in usual sterile fashion Exploration:    Hemostasis achieved with:  Direct pressure Treatment:    Area cleansed with:   Saline   Amount of cleaning:  Standard Skin repair:    Repair method:  Sutures   Suture size:  5-0   Suture material:  Prolene   Suture technique:  Simple interrupted   Number of sutures:  5 Approximation:    Approximation:  Close Repair type:    Repair type:  Simple Post-procedure details:    Dressing:  Open (no dressing)   Procedure completion:  Tolerated well, no immediate complications .Marland KitchenLaceration Repair  Date/Time: 06/02/2023 4:07 AM  Performed by: Darrick Grinder, PA-C Authorized by: Darrick Grinder, PA-C   Consent:    Consent obtained:  Verbal   Consent given by:  Patient   Risks discussed:  Pain, poor cosmetic result, poor wound healing, nerve damage and need for additional repair Anesthesia:    Anesthesia method:  Local infiltration and nerve block   Local anesthetic:  Bupivacaine 0.5% w/o epi   Block location:  Digital block   Block needle gauge:  25 G   Block anesthetic:  Bupivacaine 0.5% w/o epi   Block technique:  Ring block   Block injection procedure:  Introduced needle, incremental injection, negative aspiration for blood and anatomic landmarks identified   Block outcome:  Incomplete block Laceration details:    Location:  Finger   Finger location:  L long finger   Length (cm):  2   Depth (mm):  4 Pre-procedure details:    Preparation:  Patient was prepped and draped in usual sterile fashion Exploration:    Hemostasis achieved with:  Direct pressure Treatment:    Area cleansed with:  Saline   Amount of cleaning:  Standard Skin repair:    Repair method:  Sutures   Suture size:  5-0   Suture material:  Prolene   Suture technique:  Simple interrupted   Number of sutures:  4 Approximation:    Approximation:  Close Repair type:    Repair type:  Simple Post-procedure details:    Dressing:  Open (no dressing)   Procedure completion:  Tolerated well, no immediate complications     Medications Ordered in ED Medications  bupivacaine(PF)  (MARCAINE) 0.5 % injection 10 mL (has no administration in time range)  HYDROcodone-acetaminophen (NORCO/VICODIN) 5-325 MG per tablet 1 tablet (1 tablet Oral Given 06/02/23 0237)  midazolam (VERSED) injection 2 mg (2 mg Intramuscular Given 06/02/23 0242)    ED Course/ Medical Decision Making/ A&P                             Medical Decision Making Risk Prescription drug management.   Patient presents to the emergency department complaining of  lacerations.   Patient has normal range of motion of the fingers of the left hand. She was using a clean chef's knife. There is no need for imaging for foreign bodies.   Upon arrival the patient was extremely anxious, stating she was going to have a panic attack. She was administered versed for anxiety, hydrocodone for pain. Upon reassessment she was doing much better.   The lacerations were repaired with simple interrupted sutures as noted in procedure notes.   There is no indication at this time for antibiotic treatment. The patient had a tetanus booster approximately 3 years ago. Plan to discharge home. Patient advised to have sutures removed in 7-10 days. Patient advised on wound care.         Final Clinical Impression(s) / ED Diagnoses Final diagnoses:  Laceration of left index finger without foreign body without damage to nail, initial encounter  Laceration of left middle finger without foreign body without damage to nail, initial encounter    Rx / DC Orders ED Discharge Orders     None         Pamala Duffel 06/02/23 0414    Zadie Rhine, MD 06/02/23 [phone]

## 2023-06-02 NOTE — Discharge Instructions (Signed)
You were seen today for repair of a laceration. There were 5 sutures placed in your index finger and 4 sutures placed in your middle finger. These sutures will need removal in 7-10 days and can be removed by any medical provider. Please keep your wounds clean. At work I recommend using a glove. You may take over the counter medications for pain control as needed.

## 2023-06-02 NOTE — ED Triage Notes (Signed)
The pt has cut her lt hand with a sharp knife just prior to her arrival here bleeding controlled

## 2023-06-16 ENCOUNTER — Emergency Department (HOSPITAL_COMMUNITY)
Admission: EM | Admit: 2023-06-16 | Discharge: 2023-06-16 | Disposition: A | Discharge status: Home / Self Care | Payer: BC Managed Care – PPO | Attending: Emergency Medicine | Admitting: Emergency Medicine

## 2023-06-16 ENCOUNTER — Emergency Department (HOSPITAL_COMMUNITY): Payer: BC Managed Care – PPO

## 2023-06-16 ENCOUNTER — Encounter (HOSPITAL_COMMUNITY): Payer: Self-pay

## 2023-06-16 ENCOUNTER — Other Ambulatory Visit: Payer: Self-pay

## 2023-06-16 DIAGNOSIS — R102 Pelvic and perineal pain: Secondary | ICD-10-CM | POA: Diagnosis not present

## 2023-06-16 DIAGNOSIS — D649 Anemia, unspecified: Secondary | ICD-10-CM | POA: Insufficient documentation

## 2023-06-16 DIAGNOSIS — R55 Syncope and collapse: Secondary | ICD-10-CM | POA: Diagnosis not present

## 2023-06-16 DIAGNOSIS — R1032 Left lower quadrant pain: Secondary | ICD-10-CM | POA: Insufficient documentation

## 2023-06-16 DIAGNOSIS — I959 Hypotension, unspecified: Secondary | ICD-10-CM | POA: Insufficient documentation

## 2023-06-16 LAB — CBC
HCT: 35 % — ABNORMAL LOW (ref 36.0–46.0)
Hemoglobin: 11.2 g/dL — ABNORMAL LOW (ref 12.0–15.0)
MCH: 28.3 pg (ref 26.0–34.0)
MCHC: 32 g/dL (ref 30.0–36.0)
MCV: 88.4 fL (ref 80.0–100.0)
Platelets: 330 10*3/uL (ref 150–400)
RBC: 3.96 MIL/uL (ref 3.87–5.11)
RDW: 13.7 % (ref 11.5–15.5)
WBC: 5.8 10*3/uL (ref 4.0–10.5)
nRBC: 0 % (ref 0.0–0.2)

## 2023-06-16 LAB — COMPREHENSIVE METABOLIC PANEL
ALT: 13 U/L (ref 0–44)
AST: 18 U/L (ref 15–41)
Albumin: 3.9 g/dL (ref 3.5–5.0)
Alkaline Phosphatase: 44 U/L (ref 38–126)
Anion gap: 7 (ref 5–15)
BUN: 10 mg/dL (ref 6–20)
CO2: 25 mmol/L (ref 22–32)
Calcium: 9 mg/dL (ref 8.9–10.3)
Chloride: 107 mmol/L (ref 98–111)
Creatinine, Ser: 0.61 mg/dL (ref 0.44–1.00)
GFR, Estimated: >60 mL/min (ref >60)
Glucose, Bld: 79 mg/dL (ref 70–99)
Potassium: 3.8 mmol/L (ref 3.5–5.1)
Sodium: 139 mmol/L (ref 135–145)
Total Bilirubin: 0.4 mg/dL (ref 0.3–1.2)
Total Protein: 6.5 g/dL (ref 6.5–8.1)

## 2023-06-16 LAB — RAPID URINE DRUG SCREEN, HOSP PERFORMED
Amphetamines: POSITIVE — AB
Barbiturates: NOT DETECTED
Benzodiazepines: NOT DETECTED
Cocaine: NOT DETECTED
Opiates: NOT DETECTED
Tetrahydrocannabinol: POSITIVE — AB

## 2023-06-16 LAB — URINALYSIS, ROUTINE W REFLEX MICROSCOPIC
Bilirubin Urine: NEGATIVE
Glucose, UA: NEGATIVE mg/dL
Hgb urine dipstick: NEGATIVE
Ketones, ur: 20 mg/dL — AB
Leukocytes,Ua: NEGATIVE
Nitrite: NEGATIVE
Protein, ur: NEGATIVE mg/dL
Specific Gravity, Urine: 1.017 (ref 1.005–1.030)
pH: 5 (ref 5.0–8.0)

## 2023-06-16 LAB — I-STAT CG4 LACTIC ACID, ED: Lactic Acid, Venous: 0.6 mmol/L (ref 0.5–1.9)

## 2023-06-16 LAB — LIPASE, BLOOD: Lipase: 25 U/L (ref 11–51)

## 2023-06-16 LAB — CBG MONITORING, ED: Glucose-Capillary: 71 mg/dL (ref 70–99)

## 2023-06-16 LAB — HCG, SERUM, QUALITATIVE: Preg, Serum: NEGATIVE

## 2023-06-16 MED ORDER — SODIUM CHLORIDE 0.9 % IV BOLUS
1000.0000 mL | Freq: Once | INTRAVENOUS | Status: AC
  Administered 2023-06-16: 1000 mL via INTRAVENOUS

## 2023-06-16 MED ORDER — LORAZEPAM 2 MG/ML IJ SOLN
2.0000 mg | Freq: Once | INTRAMUSCULAR | Status: AC
  Administered 2023-06-16: 2 mg via INTRAVENOUS
  Filled 2023-06-16: qty 1

## 2023-06-16 MED ORDER — FENTANYL CITRATE PF 50 MCG/ML IJ SOSY
50.0000 ug | PREFILLED_SYRINGE | Freq: Once | INTRAMUSCULAR | Status: AC
  Administered 2023-06-16: 50 ug via INTRAVENOUS
  Filled 2023-06-16: qty 1

## 2023-06-16 MED ORDER — ONDANSETRON HCL 4 MG/2ML IJ SOLN
4.0000 mg | Freq: Once | INTRAMUSCULAR | Status: AC
  Administered 2023-06-16: 4 mg via INTRAVENOUS
  Filled 2023-06-16: qty 2

## 2023-06-16 MED ORDER — IOHEXOL 350 MG/ML SOLN
75.0000 mL | Freq: Once | INTRAVENOUS | Status: AC | PRN
  Administered 2023-06-16: 75 mL via INTRAVENOUS

## 2023-06-16 MED ORDER — HYDROMORPHONE HCL 1 MG/ML IJ SOLN
1.0000 mg | INTRAMUSCULAR | Status: DC | PRN
  Administered 2023-06-16: 1 mg via INTRAVENOUS
  Filled 2023-06-16: qty 1

## 2023-06-16 NOTE — ED Provider Notes (Signed)
Received handoff from [facility], patient is currently being evaluated given left lower quadrant pain, with syncopal episode.  Her torsion study is within normal limits.  She refuses a pelvic, denies any risk of STDs.  Pending CT abdomen pelvis and urinalysis at this time.  She is well-appearing on my exam with no focal deficits.  She may have hit her head, however she has no focal neurodeficits.  Physical Exam  BP (!) 91/55 (BP Location: Right Arm)   Pulse 82   Temp 98.1 F (36.7 C) (Oral)   Resp 14   Ht 5\' 2"  (1.575 m)   Wt 57.6 kg   LMP 04/28/2023   SpO2 100%   BMI 23.23 kg/m   Physical Exam Vitals and nursing note reviewed.  Constitutional:      General: She is not in acute distress.    Appearance: She is well-developed.  HENT:     Head: Normocephalic and atraumatic.  Eyes:     Conjunctiva/sclera: Conjunctivae normal.  Cardiovascular:     Rate and Rhythm: Normal rate and regular rhythm.     Heart sounds: No murmur heard. Pulmonary:     Effort: Pulmonary effort is normal. No respiratory distress.     Breath sounds: Normal breath sounds.  Abdominal:     Palpations: Abdomen is soft.     Tenderness: There is no abdominal tenderness.  Musculoskeletal:        General: No swelling.     Cervical back: Neck supple.  Skin:    General: Skin is warm and dry.     Capillary Refill: Capillary refill takes less than 2 seconds.  Neurological:     Mental Status: She is alert.  Psychiatric:        Mood and Affect: Mood normal.     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  She was instructed to follow-up and get an MRI outpatient.  She voiced understanding of this.  She is well-appearing on my exam, and pain is improved.  We will discharge her home, with strict return precautions.  All of her labs are reassuring, she will did have a low blood pressure, on exam however given her age drug use, and no changes in mental status as well as resolution of her pain, this may be her normal blood pressure.  Hemoglobin is stable, no neurodeficits, and unremarkable CT abdomen pelvis.  Amount and/or Complexity of Data Reviewed Labs: ordered. Decision-making details documented in ED Course. Radiology: ordered. Decision-making details documented in ED Course.  Risk Prescription drug management.        Pete Pelt, [place] 06/16/23 1846    Sloan Leiter, DO 06/17/23 1527

## 2023-06-16 NOTE — ED Notes (Signed)
US at bedside

## 2023-06-16 NOTE — ED Provider Notes (Addendum)
[place] EMERGENCY DEPARTMENT AT [facility] Provider Note   CSN: [id number] Arrival date & time: 06/16/23  4098     History  Chief Complaint  Patient presents with   Abdominal Pain   Vaginal Bleeding   Loss of Consciousness    Martha Velasquez is a 22 y.o. female with history of ovarian cyst presents with concern for acute onset left lower quadrant/pelvic pain that started at work this morning.  She had heavy vaginal bleeding that last night and soaked through a super tampon in 1 hour, was not supposed to be on her period.  She says her vaginal bleeding resolved earlier today at work.   Today at work she had sudden onset of left lower quadrant pain which caused her to pass out.  She is unsure if she hit her head.  She reported an episode of yellow emesis as the pain came on. She denies any dysuria or hematuria but endorses increased frequency.  Notes yellowish vaginal discharge a couple days ago, but no vaginal pain.  No concern for STIs.    Abdominal Pain Associated symptoms: vaginal bleeding   Vaginal Bleeding Associated symptoms: abdominal pain   Loss of Consciousness      Home Medications Prior to Admission medications   Medication Sig Start Date End Date Taking? Authorizing Provider  ARIPiprazole (ABILIFY) 2 MG tablet Take 1 tablet (2 mg total) by mouth daily. 12/16/20   Dale [place], FNP  FLUoxetine (PROZAC) 20 MG capsule Take 1 capsule (20 mg total) by mouth daily. 12/16/20   Dale Breda, FNP  HYDROcodone-acetaminophen (NORCO/VICODIN) 5-325 MG tablet Take 1 tablet by mouth every 6 (six) hours as needed for moderate pain. 07/06/21   Bethann Berkshire, MD  ibuprofen (ADVIL) 600 MG tablet Take 1 tablet (600 mg total) by mouth every 6 (six) hours. 12/16/20   Dale Mermentau, FNP  iron polysaccharides (NIFEREX) 150 MG capsule Take 1 capsule (150 mg total) by mouth daily. 12/16/20   Dale [place], FNP  Prenatal Vit-Fe Fumarate-FA (MULTIVITAMIN-PRENATAL) 27-0.8 MG  TABS tablet Take 1 tablet by mouth daily at 12 noon.    [provider]      Allergies    Aspirin and Penicillins    Review of Systems   Review of Systems  Cardiovascular:  Positive for syncope.  Gastrointestinal:  Positive for abdominal pain.  Genitourinary:  Positive for vaginal bleeding.    Physical Exam Updated Vital Signs BP (!) 91/55 (BP Location: Right Arm)   Pulse 82   Temp 98.1 F (36.7 C) (Oral)   Resp 14   Ht 5\' 2"  (1.575 m)   Wt 57.6 kg   LMP 04/28/2023   SpO2 100%   BMI 23.23 kg/m  Physical Exam Vitals and nursing note reviewed.  Constitutional:      Appearance: Normal appearance.     Comments: Uncomfortable appearing, curled up into a ball on the bed  HENT:     Head: Atraumatic.  Eyes:     Extraocular Movements: Extraocular movements intact.     Pupils: Pupils are equal, round, and reactive to light.  Cardiovascular:     Rate and Rhythm: Normal rate and regular rhythm.  Pulmonary:     Effort: Pulmonary effort is normal. No respiratory distress.     Breath sounds: Normal breath sounds.  Abdominal:     General: Abdomen is flat.     Palpations: Abdomen is soft.     Comments: No Tenderness to palpation of the RUQ, RLQ,  LUQ Minimally tender to palpation of the LLQ No obvious edema, erythema, rashes of the abdomen No hernias  Genitourinary:    Comments: Patient declines pelvic exam Musculoskeletal:     Cervical back: Normal range of motion.  Neurological:     General: No focal deficit present.     Mental Status: She is alert.     Cranial Nerves: No cranial nerve deficit.  Psychiatric:        Mood and Affect: Mood normal.        Behavior: Behavior normal.     ED Results / Procedures / Treatments   Labs (all labs ordered are listed, but only abnormal results are displayed) Labs Reviewed  CBC - Abnormal; Notable for the following components:      Result Value   Hemoglobin 11.2 (*)    HCT 35.0 (*)    All other components within normal  limits  LIPASE, BLOOD  COMPREHENSIVE METABOLIC PANEL  HCG, SERUM, QUALITATIVE  URINALYSIS, ROUTINE W REFLEX MICROSCOPIC  RAPID URINE DRUG SCREEN, HOSP PERFORMED  CBG MONITORING, ED  I-STAT CG4 LACTIC ACID, ED    EKG None  Radiology US Pelvis Complete  Result Date: 06/16/2023 CLINICAL DATA:  r/o torsion; [id number] Left lower quadrant pain 149239 EXAM: TRANSABDOMINAL ULTRASOUND OF PELVIS DOPPLER ULTRASOUND OF OVARIES TECHNIQUE: Transabdominal ultrasound examination of the pelvis was performed including evaluation of the uterus, ovaries, adnexal regions, and pelvic cul-de-sac. Color and duplex Doppler ultrasound was utilized to evaluate blood flow to the ovaries. COMPARISON:  Ob ultrasound dated April 22, 2020. FINDINGS: Evaluation is limited by transabdominal technique. Uterus Measurements: 8.1 x 3.8 x 5.7 cm = volume: 92 mL. No fibroids or other mass visualized. Endometrium Thickness: 10 mm.  No focal abnormality visualized. Right ovary Measurements: 4.0 x 2.2 x 3.2 cm = volume: 14.8 mL. Normal appearance/no adnexal mass. Left ovary Measurements: 3.5 x 1.3 x 2.0 cm = volume: 4.8 mL. Normal appearance/no adnexal mass. Pulsed Doppler evaluation demonstrates normal low-resistance arterial and venous waveforms in both ovaries. Other: There are several fluid-filled loops of bowel noted. IMPRESSION: 1. No sonographic evidence of ovarian torsion within the limitations of a transabdominal exam. 2. There are several fluid-filled loops of bowel noted, nonspecific. Recommend correlation with any clinical signs or symptoms of enteritis. Electronically Signed   By: Meda Klinefelter M.D.   On: 06/16/2023 13:04   US PELVIC DOPPLER (TORSION R/O OR MASS ARTERIAL FLOW)  Result Date: 06/16/2023 CLINICAL DATA:  r/o torsion; [id number] Left lower quadrant pain 149239 EXAM: TRANSABDOMINAL ULTRASOUND OF PELVIS DOPPLER ULTRASOUND OF OVARIES TECHNIQUE: Transabdominal ultrasound examination of the pelvis was performed including  evaluation of the uterus, ovaries, adnexal regions, and pelvic cul-de-sac. Color and duplex Doppler ultrasound was utilized to evaluate blood flow to the ovaries. COMPARISON:  Ob ultrasound dated April 22, 2020. FINDINGS: Evaluation is limited by transabdominal technique. Uterus Measurements: 8.1 x 3.8 x 5.7 cm = volume: 92 mL. No fibroids or other mass visualized. Endometrium Thickness: 10 mm.  No focal abnormality visualized. Right ovary Measurements: 4.0 x 2.2 x 3.2 cm = volume: 14.8 mL. Normal appearance/no adnexal mass. Left ovary Measurements: 3.5 x 1.3 x 2.0 cm = volume: 4.8 mL. Normal appearance/no adnexal mass. Pulsed Doppler evaluation demonstrates normal low-resistance arterial and venous waveforms in both ovaries. Other: There are several fluid-filled loops of bowel noted. IMPRESSION: 1. No sonographic evidence of ovarian torsion within the limitations of a transabdominal exam. 2. There are several fluid-filled loops of bowel noted, nonspecific. Recommend correlation  with any clinical signs or symptoms of enteritis. Electronically Signed   By: Meda Klinefelter M.D.   On: 06/16/2023 13:04    Procedures Procedures    Medications Ordered in ED Medications  HYDROmorphone (DILAUDID) injection 1 mg (1 mg Intravenous Given 06/16/23 1351)  LORazepam (ATIVAN) injection 2 mg (has no administration in time range)  sodium chloride 0.9 % bolus 1,000 mL (0 mLs Intravenous Stopped 06/16/23 1342)  fentaNYL (SUBLIMAZE) injection 50 mcg (50 mcg Intravenous Given 06/16/23 1210)  ondansetron (ZOFRAN) injection 4 mg (4 mg Intravenous Given 06/16/23 1212)  sodium chloride 0.9 % bolus 1,000 mL (1,000 mLs Intravenous New Bag/Given 06/16/23 1348)    ED Course/ Medical Decision Making/ A&P Clinical Course as of 06/16/23 1517  Sun Jun 16, 2023  1455 CT ABDOMEN PELVIS W CONTRAST [DR]  1455 Urinalysis, Routine w reflex microscopic -Urine, Clean Catch [AF]    Clinical Course User Index [AF] Arabella Merles,  PA-C [DR] Margarita Grizzle, MD                             Medical Decision Making Amount and/or Complexity of Data Reviewed Labs: ordered. Decision-making details documented in ED Course. Radiology: ordered. Decision-making details documented in ED Course.  Risk Prescription drug management.   22 y.o. female with pertinent past medical history of ovarian cyst presents to the ED for concern of sudden onset left lower quadrant/pelvic pain this morning, heavy vaginal bleeding that started last night  Differential diagnosis includes but is not limited to ovarian torsion, uterine fibroids, pregnancy, arrhythmia, electrolyte abnormality, hypoglycemia, sepsis  ED Course:  Patient uncomfortable appearing and curled up on the bed.  States she had heavy vaginal bleeding that started last night, she is not supposed be on her period.  Has been soaking through 1 super tampon an hour but this resolved this morning.  Now with sudden onset of left lower quadrant pain this morning at work that caused her to pass out.  Also had associated vomiting.  She has a low blood pressure upon arrival at 92/58 with no tachycardia.  Does have a history of ovarian cyst, and due to sudden onset of significant left lower quadrant pain and vaginal bleeding, ordered ultrasound to evaluate for ovarian torsion.  EKG with no findings concerning for arrhythmia, no hypoglycemia, hemoglobin at patient baseline.  Serum hCG negative for pregnancy. Patient given 50 mcg fentanyl and Zofran for pain and nausea. Ordered Dilaudid for further pain control q4 hrs prn. Patient given 1 L normal saline bolus for low blood pressures.  Pelvic ultrasound with no signs of ovarian torsion, possible enteritis given fluid-filled bowel.  1:37 PM Upon re-evaluation, patient still curled in bed but her pain has improved with fentanyl.  Discussed that I would like to do a pelvic exam to further evaluate her pain and vaginal bleeding, she refuses an exam at  this time.  Will order CT abdomen pelvis for further evaluation of her left lower quadrant pain.  Another bolus of normal saline ordered as blood pressure is still low at 98/50. 2:57 PM Dr. Rosalia Hammers also evaluated patient, we will proceed with in and out cath if she is unable to provide urine sample.  Will also evaluate a lactic acid level and give Ativan for her anxiety.  Still awaiting results of CT abdomen pelvis. Less concerned for sepsis currently given no tachycardia, no increased respiratory rate, no white count. Will make sure she does not have  UTI when urine sample obtained.   Impression: Left lower quadrant pain  Disposition:  Patient handed off to [employer] PA who will follow up on urinalysis, lactic acid, and CT AP and determine disposition.   Lab Tests: I Ordered, and personally interpreted labs.  The pertinent results include:   CBC with no leukocytosis, hemoglobin at 11.2 but this is above patient baseline CMP, lipase all within normal limits Serum hCG negative for pregnancy CBG 71  Imaging Studies ordered: I ordered imaging studies including pelvic ultrasound I independently visualized the imaging with scope of interpretation limited to determining acute life threatening conditions related to emergency care. Imaging showed no signs of ovarian torsion, fluid-filled loops of bowel.  I agree with the radiologist interpretation   Cardiac Monitoring: / EKG: The patient was maintained on a cardiac monitor.  I personally viewed and interpreted the cardiac monitored which showed an underlying rhythm of: Normal sinus rhythm              Final Clinical Impression(s) / ED Diagnoses Final diagnoses:  LLQ pain  Hypotension, unspecified hypotension type    Rx / DC Orders ED Discharge Orders     None         Arabella Merles, PA-C 06/16/23 1518    Arabella Merles, PA-C 06/16/23 1725    Arabella Merles, PA-C 06/16/23 1741    Margarita Grizzle,  MD 06/19/23 1235

## 2023-06-16 NOTE — ED Triage Notes (Signed)
Pt arrived from work where she stated she passed out. Pt states she started with a sharp pain and irregular vaginal bleeding this morning. Pt states she already had what she thought was her period for the month but then today this started and the pain go so bad she passed out.

## 2023-06-16 NOTE — ED Notes (Signed)
Pt laying in fetal position,crying stating she is "scared because I don't know what's wrong with me"

## 2023-06-16 NOTE — ED Notes (Signed)
Patient transported to CT 

## 2023-06-16 NOTE — ED Notes (Signed)
Mother [facility] [phone] would like an update asap

## 2023-06-16 NOTE — ED Notes (Signed)
PT refusing to leave. Wants to speak to provider. Pt in tears.. states she is not feeling better after she advised myself and the provider that she was not bleeding anymore and not having any pain.Marland Kitchen PA advised ..she is busy in a trauma at this time and will speak to her when she is available.

## 2023-06-16 NOTE — ED Notes (Signed)
Pt left without speaking to provider again.Martha KitchenMarland Velasquez

## 2023-06-16 NOTE — Discharge Instructions (Addendum)
Please follow-up with your primary care doctor, the cause of your left lower quadrant pain is unclear at this time, however I am glad you are feeling better.  Return to the ER if you get lightheaded, dizzy, or short of breath.
